# Patient Record
Sex: Female | Born: 1970
Health system: Southern US, Community
[De-identification: ages and names within clinical notes are randomized; demographics above are authoritative.]

## PROBLEM LIST (undated history)

## (undated) DIAGNOSIS — M549 Dorsalgia, unspecified: Secondary | ICD-10-CM

## (undated) DIAGNOSIS — I8393 Asymptomatic varicose veins of bilateral lower extremities: Secondary | ICD-10-CM

## (undated) HISTORY — PX: TUBAL LIGATION: SHX77

## (undated) HISTORY — DX: Asymptomatic varicose veins of bilateral lower extremities: I83.93

## (undated) HISTORY — DX: Dorsalgia, unspecified: M54.9

## (undated) HISTORY — PX: AUGMENTATION MAMMAPLASTY: SUR837

---

## 2004-11-29 ENCOUNTER — Emergency Department: Payer: Self-pay | Admitting: Emergency Medicine

## 2016-02-11 DIAGNOSIS — M5402 Panniculitis affecting regions of neck and back, cervical region: Secondary | ICD-10-CM | POA: Diagnosis not present

## 2016-02-11 DIAGNOSIS — M5405 Panniculitis affecting regions of neck and back, thoracolumbar region: Secondary | ICD-10-CM | POA: Diagnosis not present

## 2016-02-11 DIAGNOSIS — M9903 Segmental and somatic dysfunction of lumbar region: Secondary | ICD-10-CM | POA: Diagnosis not present

## 2016-02-25 DIAGNOSIS — L570 Actinic keratosis: Secondary | ICD-10-CM | POA: Diagnosis not present

## 2016-02-25 DIAGNOSIS — I788 Other diseases of capillaries: Secondary | ICD-10-CM | POA: Diagnosis not present

## 2016-02-25 DIAGNOSIS — D1801 Hemangioma of skin and subcutaneous tissue: Secondary | ICD-10-CM | POA: Diagnosis not present

## 2016-02-29 ENCOUNTER — Other Ambulatory Visit: Payer: Self-pay | Admitting: *Deleted

## 2016-02-29 DIAGNOSIS — I83812 Varicose veins of left lower extremities with pain: Secondary | ICD-10-CM

## 2016-03-02 ENCOUNTER — Encounter (HOSPITAL_COMMUNITY): Payer: Self-pay

## 2016-03-02 ENCOUNTER — Other Ambulatory Visit: Payer: Self-pay

## 2016-03-07 ENCOUNTER — Encounter: Payer: Self-pay | Admitting: Vascular Surgery

## 2016-03-09 ENCOUNTER — Ambulatory Visit (HOSPITAL_COMMUNITY)
Admission: RE | Admit: 2016-03-09 | Discharge: 2016-03-09 | Disposition: A | Discharge status: Home / Self Care | Payer: Commercial Managed Care - HMO | Source: Ambulatory Visit | Attending: Vascular Surgery | Admitting: Vascular Surgery

## 2016-03-09 DIAGNOSIS — I83812 Varicose veins of left lower extremities with pain: Secondary | ICD-10-CM | POA: Insufficient documentation

## 2016-03-13 ENCOUNTER — Encounter: Payer: Self-pay | Admitting: Vascular Surgery

## 2016-03-13 ENCOUNTER — Ambulatory Visit (INDEPENDENT_AMBULATORY_CARE_PROVIDER_SITE_OTHER): Payer: Commercial Managed Care - HMO | Admitting: Vascular Surgery

## 2016-03-13 VITALS — BP 104/73 | HR 70 | Temp 97.7°F | Resp 16 | Ht 71.5 in | Wt 195.0 lb

## 2016-03-13 DIAGNOSIS — I781 Nevus, non-neoplastic: Secondary | ICD-10-CM

## 2016-03-13 NOTE — Progress Notes (Signed)
Subjective:     Patient ID: Emily Jacobson, female   DOB: [birth year], 46 y.o.   MRN: [id number]  HPI 46 year old female evaluated for a painful varicosity in the left leg. He has been having some itching and stinging discomfort in the posterior aspect of her left leg for several months. She denies any distal edema. She has not noted bulging varicosities. She has no history of DVT, phlebitis stasis ulcers bleeding P Richards not were elastic compression stockings.  Past Medical History:  Diagnosis Date  . Varicose veins of both lower extremities     Social History  Substance Use Topics  . Smoking status: Never Smoker  . Smokeless tobacco: Never Used  . Alcohol use No    No family history on file.  Allergies  Allergen Reactions  . Penicillins Rash     Current Outpatient Prescriptions:  .  traMADol (ULTRAM) 50 MG tablet, , Disp: , Rfl:   Vitals:   03/13/16 1416  BP: 104/73  Pulse: 70  Resp: 16  Temp: 97.7 F (36.5 C)  SpO2: 100%  Weight: 195 lb (88.5 kg)  Height: 5' 11.5" (1.816 m)    Body mass index is 26.82 kg/m.         Review of Systems Chest pain, dyspnea on exertion, PND, orthopnea, hemoptysis. All systems negative and complete review of systems    Objective:   Physical Exam BP 104/73 (BP Location: Left Arm, Patient Position: Sitting, Cuff Size: Normal)   Pulse 70   Temp 97.7 F (36.5 C)   Resp 16   Ht 5' 11.5" (1.816 m)   Wt 195 lb (88.5 kg)   SpO2 100%   BMI 26.82 kg/m     Gen.-alert and oriented x3 in no apparent distress HEENT normal for age Lungs no rhonchi or wheezing Cardiovascular regular rhythm no murmurs carotid pulses 3+ palpable no bruits audible Abdomen soft nontender no palpable masses Musculoskeletal free of  major deformities Skin clear -no rashes Neurologic normal Lower extremities 3+ femoral and dorsalis pedis pulses palpable bilaterally with no edema After posterior distal lateral thigh has small area of spider veins  over 3 x 4 cm area. No bulging varicosities noted in either leg. No hyperpigmentation or ulceration noted.  Today I do bedside sono site exam of the left leg which revealed a normal sized left great and small saphenous veins with no reflux Also reviewed the report of the reflux study performed 03/09/2016 which revealed some reflux in the left great saphenous vein but a small caliber vein.       Assessment:     Spider veins left leg    Plan:     Discussed with patient the treatment would consist of foam sclerotherapy and gave her the pros and cons of this treatment She should decide she would like to further pursue this she will be in touch with Thea Silversmith for further consideration

## 2016-03-21 DIAGNOSIS — G8929 Other chronic pain: Secondary | ICD-10-CM | POA: Diagnosis not present

## 2016-03-21 DIAGNOSIS — M542 Cervicalgia: Secondary | ICD-10-CM | POA: Diagnosis not present

## 2016-03-21 DIAGNOSIS — M545 Low back pain: Secondary | ICD-10-CM | POA: Diagnosis not present

## 2016-03-28 DIAGNOSIS — M545 Low back pain: Secondary | ICD-10-CM | POA: Diagnosis not present

## 2016-04-18 DIAGNOSIS — M542 Cervicalgia: Secondary | ICD-10-CM | POA: Diagnosis not present

## 2016-04-18 DIAGNOSIS — G8929 Other chronic pain: Secondary | ICD-10-CM | POA: Diagnosis not present

## 2016-05-01 ENCOUNTER — Ambulatory Visit: Payer: Self-pay | Admitting: Obstetrics & Gynecology

## 2016-05-04 ENCOUNTER — Ambulatory Visit: Payer: Self-pay | Admitting: Obstetrics & Gynecology

## 2016-05-04 DIAGNOSIS — Z0289 Encounter for other administrative examinations: Secondary | ICD-10-CM

## 2016-05-16 DIAGNOSIS — M50122 Cervical disc disorder at C5-C6 level with radiculopathy: Secondary | ICD-10-CM | POA: Diagnosis not present

## 2016-05-16 DIAGNOSIS — M542 Cervicalgia: Secondary | ICD-10-CM | POA: Diagnosis not present

## 2016-05-16 DIAGNOSIS — G8929 Other chronic pain: Secondary | ICD-10-CM | POA: Diagnosis not present

## 2016-05-23 ENCOUNTER — Other Ambulatory Visit: Payer: Self-pay | Admitting: Obstetrics & Gynecology

## 2016-05-23 ENCOUNTER — Encounter: Payer: Self-pay | Admitting: Obstetrics & Gynecology

## 2016-05-23 ENCOUNTER — Ambulatory Visit (INDEPENDENT_AMBULATORY_CARE_PROVIDER_SITE_OTHER): Payer: Commercial Managed Care - HMO | Admitting: Obstetrics & Gynecology

## 2016-05-23 VITALS — BP 128/86 | Ht 71.0 in | Wt 198.0 lb

## 2016-05-23 DIAGNOSIS — Z01419 Encounter for gynecological examination (general) (routine) without abnormal findings: Secondary | ICD-10-CM | POA: Diagnosis not present

## 2016-05-23 DIAGNOSIS — Z789 Other specified health status: Secondary | ICD-10-CM

## 2016-05-23 DIAGNOSIS — Z9851 Tubal ligation status: Secondary | ICD-10-CM

## 2016-05-23 DIAGNOSIS — Z1231 Encounter for screening mammogram for malignant neoplasm of breast: Secondary | ICD-10-CM

## 2016-05-23 NOTE — Progress Notes (Signed)
    Emily Jacobson [birth year] [id number]   History:    46 y.o.  G2P2 S/P BT/S.  Daughter 34 yo and son 48 yo.  Remarried, moved from [place].  RP:  New patient presenting for annual gyn exam  Menses reg qmonth normal flow.  On period today.  No pelvic pain.  No vaginal d/c.  Breasts wnl.  Past medical history,surgical history, family history and social history were all reviewed and documented in the EPIC chart.  Gynecologic History Patient's last menstrual period was 05/21/2016. Contraception: tubal ligation Last Pap: 3 yrs ago. Results were: normal per patient Last mammogram: Doesn't remember. Results were: normal per patient  Obstetric History OB History  Gravida Para Term Preterm AB Living  2 2       2   SAB TAB Ectopic Multiple Live Births               # Outcome Date GA Lbr Len/2nd Weight Sex Delivery Anes PTL Lv  2 Para           1 Para                ROS: A ROS was performed and pertinent positives and negatives are included in the history.  GENERAL: No fevers or chills. HEENT: No change in vision, no earache, sore throat or sinus congestion. NECK: No pain or stiffness. CARDIOVASCULAR: No chest pain or pressure. No palpitations. PULMONARY: No shortness of breath, cough or wheeze. GASTROINTESTINAL: No abdominal pain, nausea, vomiting or diarrhea, melena or bright red blood per rectum. GENITOURINARY: No urinary frequency, urgency, hesitancy or dysuria. MUSCULOSKELETAL: No joint or muscle pain, no back pain, no recent trauma. DERMATOLOGIC: No rash, no itching, no lesions. ENDOCRINE: No polyuria, polydipsia, no heat or cold intolerance. No recent change in weight. HEMATOLOGICAL: No anemia or easy bruising or bleeding. NEUROLOGIC: No headache, seizures, numbness, tingling or weakness. PSYCHIATRIC: No depression, no loss of interest in normal activity or change in sleep pattern.     Exam:   BP 128/86   Ht 5\' 11"  (1.803 m)   Wt 198 lb (89.8 kg)   LMP 05/21/2016   BMI 27.62  kg/m   Body mass index is 27.62 kg/m.  General appearance : Well developed well nourished female. No acute distress HEENT: Eyes: no retinal hemorrhage or exudates,  Neck supple, trachea midline, no carotid bruits, no thyroidmegaly Lungs: Clear to auscultation, no rhonchi or wheezes, or rib retractions  Heart: Regular rate and rhythm, no murmurs or gallops Breast:Examined in sitting and supine position were symmetrical in appearance, no palpable masses or tenderness,  no skin retraction, no nipple inversion, no nipple discharge, no skin discoloration, no axillary or supraclavicular lymphadenopathy Abdomen: no palpable masses or tenderness, no rebound or guarding Extremities: no edema or skin discoloration or tenderness  Pelvic:  Deferred, re menstruating currently    Assessment/Plan:  46 y.o. female for annual exam   1. Well woman exam Normal general exam.  Will complete Gyn exam/Pap HPV at f/u when off period.  Schedule screening Mammo.  2. Status post tubal ligation   3. Last menstrual period (LMP) date this week On period.  Will f/u to complete Gyn exam with Pap/HPV HR.  Princess Bruins MD, 11:31 AM 05/23/2016

## 2016-05-25 NOTE — Patient Instructions (Signed)
1. Well woman exam Normal general exam.  Will complete Gyn exam/Pap HPV at f/u when off period.  Schedule screening Mammo.  2. Status post tubal ligation   3. Last menstrual period (LMP) date this week On period.  Will f/u to complete Gyn exam with Pap/HPV HR.  Kenley, it was a pleasure to meet you today!  I will see you soon to complete your Gyn exam and Pap.

## 2016-05-29 DIAGNOSIS — M542 Cervicalgia: Secondary | ICD-10-CM | POA: Diagnosis not present

## 2016-05-30 ENCOUNTER — Ambulatory Visit (INDEPENDENT_AMBULATORY_CARE_PROVIDER_SITE_OTHER): Payer: Commercial Managed Care - HMO | Admitting: Obstetrics & Gynecology

## 2016-05-30 ENCOUNTER — Encounter: Payer: Self-pay | Admitting: Obstetrics & Gynecology

## 2016-05-30 VITALS — BP 112/72

## 2016-05-30 DIAGNOSIS — Z1151 Encounter for screening for human papillomavirus (HPV): Secondary | ICD-10-CM

## 2016-05-30 DIAGNOSIS — Z124 Encounter for screening for malignant neoplasm of cervix: Secondary | ICD-10-CM | POA: Diagnosis not present

## 2016-05-30 DIAGNOSIS — N971 Female infertility of tubal origin: Secondary | ICD-10-CM

## 2016-05-30 DIAGNOSIS — Z113 Encounter for screening for infections with a predominantly sexual mode of transmission: Secondary | ICD-10-CM

## 2016-05-30 DIAGNOSIS — Z01419 Encounter for gynecological examination (general) (routine) without abnormal findings: Secondary | ICD-10-CM

## 2016-05-30 NOTE — Progress Notes (Signed)
    Emily Jacobson 05/10/1970 [id number]        46 y.o.  G2P2  S/P Tubal Ligation  RP:  Complete Annual exam with Gyn exam and Pap test  Past medical history,surgical history, problem list, medications, allergies, family history and social history were all reviewed and documented in the EPIC chart.  Directed ROS with pertinent positives and negatives documented in the history of present illness/assessment and plan.  Exam:  Vitals:   05/30/16 0949  BP: 112/72   General appearance:  Normal  Gyn exam:  Vulva normal                     Speculum:  Vagina, cervix normal.  Pap/HPV, Gono-Chlam done.  Assessment/Plan:  46 y.o. G2P2   1. Pap smear for cervical cancer screening Pap/HPV done today to complete Annual/Gyn exam as patient was on period last visit.  2. Gynecologic exam normal   3. Screen for STD (sexually transmitted disease) Gono-Chlam pending.  4. 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  AMA 46 yo.  Counseling on Fertility.  Discussed [facility] to evaluate Ovarian potential for Hyperstimulation/IVF and discussed Tubal reversal.  Risks/Benefits/Likelihood to conceive reviewed.  Need to obtain O.R. BT/S if going that route, but recommended [facility] with referral for IVF if good.  Counseling >50% on Secondary tubal infertility/AMA x 10 minutes.  Princess Bruins MD, 9:58 AM 05/30/2016

## 2016-05-30 NOTE — Patient Instructions (Signed)
1. Pap smear for cervical cancer screening Pap/HPV done today to complete Annual/Gyn exam as patient was on period last visit.  2. Gynecologic exam normal   3. Screen for STD (sexually transmitted disease) Gono-Chlam pending.  4. 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  AMA 46 yo.  Counseling on Fertility.  Discussed [facility] to evaluate Ovarian potential for Hyperstimulation/IVF and discussed Tubal reversal.  Risks/Benefits/Likelihood to conceive reviewed.  Need to obtain O.R. BT/S if going that route, but recommended [facility] with referral for IVF if good.   Good to see you today Emily Jacobson!  Let me know if you want to proceed with Fertility management.

## 2016-06-02 LAB — PAP IG, CT-NG NAA, HPV HIGH-RISK
Chlamydia Probe Amp: NOT DETECTED
GC Probe Amp: NOT DETECTED
HPV DNA HIGH RISK: NOT DETECTED

## 2016-06-13 DIAGNOSIS — M50122 Cervical disc disorder at C5-C6 level with radiculopathy: Secondary | ICD-10-CM | POA: Diagnosis not present

## 2016-06-13 DIAGNOSIS — M5136 Other intervertebral disc degeneration, lumbar region: Secondary | ICD-10-CM | POA: Diagnosis not present

## 2016-06-19 ENCOUNTER — Ambulatory Visit: Payer: Commercial Managed Care - HMO

## 2016-06-29 DIAGNOSIS — I83813 Varicose veins of bilateral lower extremities with pain: Secondary | ICD-10-CM | POA: Diagnosis not present

## 2016-07-11 DIAGNOSIS — M50122 Cervical disc disorder at C5-C6 level with radiculopathy: Secondary | ICD-10-CM | POA: Diagnosis not present

## 2016-07-11 DIAGNOSIS — M5136 Other intervertebral disc degeneration, lumbar region: Secondary | ICD-10-CM | POA: Diagnosis not present

## 2016-07-18 DIAGNOSIS — I83813 Varicose veins of bilateral lower extremities with pain: Secondary | ICD-10-CM | POA: Diagnosis not present

## 2016-08-01 DIAGNOSIS — I83813 Varicose veins of bilateral lower extremities with pain: Secondary | ICD-10-CM | POA: Diagnosis not present

## 2016-10-26 DIAGNOSIS — M5136 Other intervertebral disc degeneration, lumbar region: Secondary | ICD-10-CM | POA: Diagnosis not present

## 2016-11-17 DIAGNOSIS — I8311 Varicose veins of right lower extremity with inflammation: Secondary | ICD-10-CM | POA: Diagnosis not present

## 2016-11-17 DIAGNOSIS — I83811 Varicose veins of right lower extremities with pain: Secondary | ICD-10-CM | POA: Diagnosis not present

## 2016-11-21 DIAGNOSIS — I8311 Varicose veins of right lower extremity with inflammation: Secondary | ICD-10-CM | POA: Diagnosis not present

## 2016-12-12 DIAGNOSIS — D1721 Benign lipomatous neoplasm of skin and subcutaneous tissue of right arm: Secondary | ICD-10-CM | POA: Diagnosis not present

## 2017-01-02 ENCOUNTER — Ambulatory Visit: Payer: Commercial Managed Care - HMO

## 2017-02-01 ENCOUNTER — Ambulatory Visit
Admission: RE | Admit: 2017-02-01 | Discharge: 2017-02-01 | Disposition: A | Discharge status: Home / Self Care | Payer: Commercial Managed Care - HMO | Source: Ambulatory Visit | Attending: Obstetrics & Gynecology | Admitting: Obstetrics & Gynecology

## 2017-02-01 DIAGNOSIS — Z1231 Encounter for screening mammogram for malignant neoplasm of breast: Secondary | ICD-10-CM

## 2017-02-01 DIAGNOSIS — J0140 Acute pansinusitis, unspecified: Secondary | ICD-10-CM | POA: Diagnosis not present

## 2017-02-22 DIAGNOSIS — M79644 Pain in right finger(s): Secondary | ICD-10-CM | POA: Diagnosis not present

## 2017-02-22 DIAGNOSIS — S63632A Sprain of interphalangeal joint of right middle finger, initial encounter: Secondary | ICD-10-CM | POA: Diagnosis not present

## 2017-02-27 DIAGNOSIS — M5030 Other cervical disc degeneration, unspecified cervical region: Secondary | ICD-10-CM | POA: Diagnosis not present

## 2017-02-27 DIAGNOSIS — M47816 Spondylosis without myelopathy or radiculopathy, lumbar region: Secondary | ICD-10-CM | POA: Diagnosis not present

## 2017-03-07 DIAGNOSIS — I83811 Varicose veins of right lower extremities with pain: Secondary | ICD-10-CM | POA: Diagnosis not present

## 2017-03-07 DIAGNOSIS — I8311 Varicose veins of right lower extremity with inflammation: Secondary | ICD-10-CM | POA: Diagnosis not present

## 2017-03-12 DIAGNOSIS — M545 Low back pain: Secondary | ICD-10-CM | POA: Diagnosis not present

## 2017-03-12 DIAGNOSIS — R51 Headache: Secondary | ICD-10-CM | POA: Diagnosis not present

## 2017-03-12 DIAGNOSIS — M542 Cervicalgia: Secondary | ICD-10-CM | POA: Diagnosis not present

## 2017-03-20 DIAGNOSIS — M5033 Other cervical disc degeneration, cervicothoracic region: Secondary | ICD-10-CM | POA: Diagnosis not present

## 2017-03-22 DIAGNOSIS — I788 Other diseases of capillaries: Secondary | ICD-10-CM | POA: Diagnosis not present

## 2017-03-22 DIAGNOSIS — L720 Epidermal cyst: Secondary | ICD-10-CM | POA: Diagnosis not present

## 2017-03-22 DIAGNOSIS — M545 Low back pain: Secondary | ICD-10-CM | POA: Diagnosis not present

## 2017-03-22 DIAGNOSIS — C44719 Basal cell carcinoma of skin of left lower limb, including hip: Secondary | ICD-10-CM | POA: Diagnosis not present

## 2017-03-22 DIAGNOSIS — M542 Cervicalgia: Secondary | ICD-10-CM | POA: Diagnosis not present

## 2017-03-22 DIAGNOSIS — R51 Headache: Secondary | ICD-10-CM | POA: Diagnosis not present

## 2017-04-02 DIAGNOSIS — R51 Headache: Secondary | ICD-10-CM | POA: Diagnosis not present

## 2017-04-02 DIAGNOSIS — M542 Cervicalgia: Secondary | ICD-10-CM | POA: Diagnosis not present

## 2017-04-02 DIAGNOSIS — M545 Low back pain: Secondary | ICD-10-CM | POA: Diagnosis not present

## 2017-04-13 DIAGNOSIS — I83811 Varicose veins of right lower extremities with pain: Secondary | ICD-10-CM | POA: Diagnosis not present

## 2017-04-13 DIAGNOSIS — I8311 Varicose veins of right lower extremity with inflammation: Secondary | ICD-10-CM | POA: Diagnosis not present

## 2017-04-15 DIAGNOSIS — J209 Acute bronchitis, unspecified: Secondary | ICD-10-CM | POA: Diagnosis not present

## 2017-05-05 ENCOUNTER — Encounter (HOSPITAL_COMMUNITY): Payer: Self-pay | Admitting: Emergency Medicine

## 2017-05-05 ENCOUNTER — Ambulatory Visit (HOSPITAL_COMMUNITY)
Admission: EM | Admit: 2017-05-05 | Discharge: 2017-05-05 | Disposition: A | Discharge status: Home / Self Care | Payer: 59 | Attending: Family Medicine | Admitting: Family Medicine

## 2017-05-05 DIAGNOSIS — H66001 Acute suppurative otitis media without spontaneous rupture of ear drum, right ear: Secondary | ICD-10-CM

## 2017-05-05 MED ORDER — CEFDINIR 300 MG PO CAPS: 600.0000 mg | ORAL_CAPSULE | Freq: Every day | ORAL | 0 refills | Status: AC

## 2017-05-05 NOTE — ED Triage Notes (Signed)
Pt states she recently got over a sinus infection and now c/o R ear pain, with some L ear pain as well

## 2017-05-05 NOTE — Discharge Instructions (Signed)
Push fluids to ensure adequate hydration and keep secretions thin.  Tylenol and/or ibuprofen as needed for pain or fevers.  Complete course of antibiotics.  Continue with daily flonase, antihistamine and/or nasal flush or rinses may also be helpful.

## 2017-05-05 NOTE — ED Provider Notes (Signed)
[facility]    CSN: [id number] Arrival date & time: 05/05/17  1200     History   Chief Complaint Chief Complaint  Patient presents with  . Otalgia    HPI Emily Jacobson is a 47 y.o. female.   Emily Jacobson presents with complaints of right ear pain which has been worsening over the past two days. Was treated with antibiotics, steroids, nebulizer's approximately 2 weeks ago for sinus infection and bronchitis, completed course of treatment approximately 8 days ago. She feels she had approximately 3 days of improved symptoms before slight cough and congestion returned. Now with ear pain and sensation of being "under water" with fullness to bilateral ears. Has been using flonase daily. Took ibuprofen last night which did help with pain. States has had similar in the past which required antibiotics. No fevers. Without shortness of breath or chest pain. Slight congestion. Without contributing medical history.    ROS per HPI.      Past Medical History:  Diagnosis Date  . Varicose veins of both lower extremities     Patient Active Problem List   Diagnosis Date Noted  . Spider angioma of skin 03/13/2016    Past Surgical History:  Procedure Laterality Date  . AUGMENTATION MAMMAPLASTY Bilateral    Saline  . TUBAL LIGATION      OB History    Gravida  2   Para  2   Term      Preterm      AB      Living  2     SAB      TAB      Ectopic      Multiple      Live Births               Home Medications    Prior to Admission medications   Medication Sig Start Date End Date Taking? Authorizing Provider  cefdinir (OMNICEF) 300 MG capsule Take 2 capsules (600 mg total) by mouth daily for 5 days. 05/05/17 05/10/17  Zigmund Gottron, NP  ibuprofen (ADVIL,MOTRIN) 400 MG tablet Take 400 mg by mouth every 6 (six) hours as needed.    [provider]  traMADol Veatrice Bourbon) 50 MG tablet  03/09/16   [provider]    Family History Family History    Problem Relation Age of Onset  . Migraines Brother   . Cancer Paternal Aunt        colon   . Breast cancer Paternal Aunt   . Breast cancer Maternal Grandmother   . Cancer Maternal Grandmother        lung  . Breast cancer Paternal Aunt     Social History Social History   Tobacco Use  . Smoking status: Never Smoker  . Smokeless tobacco: Never Used  Substance Use Topics  . Alcohol use: No  . Drug use: No     Allergies   Penicillins   Review of Systems Review of Systems   Physical Exam Triage Vital Signs ED Triage Vitals [05/05/17 1211]  Enc Vitals Group     BP (!) 150/129     Pulse Rate 78     Resp 18     Temp (!) 97.1 F (36.2 C)     Temp src      SpO2 99 %     Weight      Height      Head Circumference      Peak Flow  Pain Score      Pain Loc      Pain Edu?      Excl. in [place]?    No data found.  Updated Vital Signs BP (!) 150/129   Pulse 78   Temp (!) 97.1 F (36.2 C)   Resp 18   SpO2 99%   Visual Acuity Right Eye Distance:   Left Eye Distance:   Bilateral Distance:    Right Eye Near:   Left Eye Near:    Bilateral Near:     Physical Exam  Constitutional: She is oriented to person, place, and time. She appears well-developed and well-nourished. No distress.  HENT:  Head: Normocephalic and atraumatic.  Right Ear: External ear and ear canal normal. Tympanic membrane is erythematous.  Left Ear: External ear and ear canal normal. A middle ear effusion is present.  Nose: Nose normal.  Mouth/Throat: Uvula is midline, oropharynx is clear and moist and mucous membranes are normal. No tonsillar exudate.  Mild erythema to right TM without significant bulging  Eyes: Pupils are equal, round, and reactive to light. Conjunctivae and EOM are normal.  Cardiovascular: Normal rate, regular rhythm and normal heart sounds.  Pulmonary/Chest: Effort normal and breath sounds normal.  Neurological: She is alert and oriented to person, place, and time.   Skin: Skin is warm and dry.     UC Treatments / Results  Labs (all labs ordered are listed, but only abnormal results are displayed) Labs Reviewed - No data to display  EKG None Radiology No results found.  Procedures Procedures (including critical care time)  Medications Ordered in UC Medications - No data to display   Initial Impression / Assessment and Plan / UC Course  I have reviewed the triage vital signs and the nursing notes.  Pertinent labs & imaging results that were available during my care of the patient were reviewed by me and considered in my medical decision making (see chart for details).     Mild OM on exam. Discussed supportive cares with patient, as well as 5 day course of cefdinir. Patient verbalized understanding and agreeable to plan.  If symptoms worsen or do not improve in the next week to return to be seen or to follow up with PCP.    Final Clinical Impressions(s) / UC Diagnoses   Final diagnoses:  Acute suppurative otitis media of right ear without spontaneous rupture of tympanic membrane, recurrence not specified    ED Discharge Orders        Ordered    cefdinir (OMNICEF) 300 MG capsule  Daily     05/05/17 1222       Controlled Substance Prescriptions [place] Controlled Substance Registry consulted? Not Applicable   Zigmund Gottron, NP 05/05/17 1230

## 2017-06-26 DIAGNOSIS — M47816 Spondylosis without myelopathy or radiculopathy, lumbar region: Secondary | ICD-10-CM | POA: Diagnosis not present

## 2017-07-26 ENCOUNTER — Encounter: Payer: Commercial Managed Care - HMO | Admitting: Obstetrics & Gynecology

## 2017-08-14 DIAGNOSIS — M25552 Pain in left hip: Secondary | ICD-10-CM | POA: Diagnosis not present

## 2017-08-14 DIAGNOSIS — M5030 Other cervical disc degeneration, unspecified cervical region: Secondary | ICD-10-CM | POA: Diagnosis not present

## 2017-08-14 DIAGNOSIS — M47816 Spondylosis without myelopathy or radiculopathy, lumbar region: Secondary | ICD-10-CM | POA: Diagnosis not present

## 2017-09-13 DIAGNOSIS — I83811 Varicose veins of right lower extremities with pain: Secondary | ICD-10-CM | POA: Diagnosis not present

## 2017-09-13 DIAGNOSIS — M545 Low back pain: Secondary | ICD-10-CM | POA: Diagnosis not present

## 2017-09-13 DIAGNOSIS — I8311 Varicose veins of right lower extremity with inflammation: Secondary | ICD-10-CM | POA: Diagnosis not present

## 2017-09-19 DIAGNOSIS — M5030 Other cervical disc degeneration, unspecified cervical region: Secondary | ICD-10-CM | POA: Diagnosis not present

## 2017-09-19 DIAGNOSIS — M47816 Spondylosis without myelopathy or radiculopathy, lumbar region: Secondary | ICD-10-CM | POA: Diagnosis not present

## 2017-09-19 DIAGNOSIS — M5416 Radiculopathy, lumbar region: Secondary | ICD-10-CM | POA: Diagnosis not present

## 2017-09-25 DIAGNOSIS — M7062 Trochanteric bursitis, left hip: Secondary | ICD-10-CM | POA: Diagnosis not present

## 2017-09-25 DIAGNOSIS — M25552 Pain in left hip: Secondary | ICD-10-CM | POA: Diagnosis not present

## 2017-09-25 DIAGNOSIS — M5136 Other intervertebral disc degeneration, lumbar region: Secondary | ICD-10-CM | POA: Diagnosis not present

## 2017-10-03 DIAGNOSIS — M25552 Pain in left hip: Secondary | ICD-10-CM | POA: Diagnosis not present

## 2017-10-04 DIAGNOSIS — M47816 Spondylosis without myelopathy or radiculopathy, lumbar region: Secondary | ICD-10-CM | POA: Diagnosis not present

## 2017-10-04 DIAGNOSIS — M5416 Radiculopathy, lumbar region: Secondary | ICD-10-CM | POA: Diagnosis not present

## 2017-10-04 DIAGNOSIS — M5030 Other cervical disc degeneration, unspecified cervical region: Secondary | ICD-10-CM | POA: Diagnosis not present

## 2017-10-09 DIAGNOSIS — M25552 Pain in left hip: Secondary | ICD-10-CM | POA: Diagnosis not present

## 2017-10-10 DIAGNOSIS — M25552 Pain in left hip: Secondary | ICD-10-CM | POA: Diagnosis not present

## 2017-10-16 ENCOUNTER — Ambulatory Visit: Payer: 59 | Admitting: Obstetrics & Gynecology

## 2017-10-16 ENCOUNTER — Encounter: Payer: Self-pay | Admitting: Obstetrics & Gynecology

## 2017-10-16 VITALS — BP 126/82 | Ht 71.0 in | Wt 208.0 lb

## 2017-10-16 DIAGNOSIS — Z01419 Encounter for gynecological examination (general) (routine) without abnormal findings: Secondary | ICD-10-CM | POA: Diagnosis not present

## 2017-10-16 DIAGNOSIS — N943 Premenstrual tension syndrome: Secondary | ICD-10-CM | POA: Diagnosis not present

## 2017-10-16 DIAGNOSIS — R4586 Emotional lability: Secondary | ICD-10-CM | POA: Diagnosis not present

## 2017-10-16 DIAGNOSIS — Z9851 Tubal ligation status: Secondary | ICD-10-CM

## 2017-10-16 LAB — TSH: TSH: 0.74 m[IU]/L

## 2017-10-16 MED ORDER — NORETHINDRONE 0.35 MG PO TABS: 1.0000 | ORAL_TABLET | Freq: Every day | ORAL | 4 refills | Status: DC

## 2017-10-16 NOTE — Patient Instructions (Signed)
1. Well female exam with routine gynecological exam Normal gynecologic exam.  Pap negative with negative high-risk HPV in May 2018.  No indication to repeat this year.  Breast exam normal.  Per patient screening mammogram normal in 2019.  Body mass index 29.01.  Recommend low calorie/carb diet such as [employer] and increased aerobic activities to 5 times a week with weightlifting every 2 days.  Will establish with a family physician for health labs.  2. S/P tubal ligation  3. PMS (premenstrual syndrome) Given patient's emotional hypersensitivity worsen in the premenstrual period, decision to start on the progesterone only birth control pill to block ovulation and therefore stabilized hormones.  Usage and the fact that it is low risk discussed with patient.  Prescription sent to pharmacy.  Will start with next menstrual period. - TSH  4. Emotional hypersensitivity Rule out thyroid dysfunction.  Start on the progesterone pill to see if any improvement by stabilization of her hormones.  Encouraged to exercise more regularly as releasing endorphins may help. - TSH  Other orders - norethindrone (MICRONOR,CAMILA,ERRIN) 0.35 MG tablet; Take 1 tablet (0.35 mg total) by mouth daily.  Layana, it was a pleasure seeing you today!  I will inform you of your results as soon as they are available.

## 2017-10-16 NOTE — Progress Notes (Signed)
Emily Jacobson Aug 07, 1970 [id number]   History:    47 y.o. G2P2L2 Remarried.  S/P TL.  Son 60 yo.  Daughter 56 yo in [place]/my patient, doing well on BCPs  RP:  Established patient presenting for annual gyn exam   HPI: Status post tubal ligation.  Menstrual periods regularly every month with normal flow.  Complains of hyper emotivity with worsened symptoms in the premenstrual period.  Cries easily for happy or sad events.  No symptoms of major depression and no suicidal ideation.  No pelvic pain.  No pain with intercourse.  Normal vaginal secretions.  Breasts normal.  Urine and bowel movements normal.  Body mass index 29.01.  Not currently very physically active.  Will find a family physician for health labs.  Investigated recently for left hip pain.  Had an MRI.  Followed by Ortho.  Will sign a release for me to give my opinion on the labs that were done associated with that investigation.  Thank you thank I can give it right back  Past medical history,surgical history, family history and social history were all reviewed and documented in the EPIC chart.  Gynecologic History Patient's last menstrual period was 10/02/2017. Contraception: tubal ligation Last Pap: 05/2016. Results were: Negative/HPV HR neg Last mammogram: 2019. Results were: normal per patient Bone Density: Never Colonoscopy: Never  Obstetric History OB History  Gravida Para Term Preterm AB Living  2 2       2   SAB TAB Ectopic Multiple Live Births               # Outcome Date GA Lbr Len/2nd Weight Sex Delivery Anes PTL Lv  2 Para           1 Para              ROS: A ROS was performed and pertinent positives and negatives are included in the history.  GENERAL: No fevers or chills. HEENT: No change in vision, no earache, sore throat or sinus congestion. NECK: No pain or stiffness. CARDIOVASCULAR: No chest pain or pressure. No palpitations. PULMONARY: No shortness of breath, cough or wheeze. GASTROINTESTINAL: No  abdominal pain, nausea, vomiting or diarrhea, melena or bright red blood per rectum. GENITOURINARY: No urinary frequency, urgency, hesitancy or dysuria. MUSCULOSKELETAL: No joint or muscle pain, no back pain, no recent trauma. DERMATOLOGIC: No rash, no itching, no lesions. ENDOCRINE: No polyuria, polydipsia, no heat or cold intolerance. No recent change in weight. HEMATOLOGICAL: No anemia or easy bruising or bleeding. NEUROLOGIC: No headache, seizures, numbness, tingling or weakness. PSYCHIATRIC: No depression, no loss of interest in normal activity or change in sleep pattern.     Exam:   BP 126/82   Ht 5\' 11"  (1.803 m)   Wt 208 lb (94.3 kg)   LMP 10/02/2017 Comment: no birth control   BMI 29.01 kg/m   Body mass index is 29.01 kg/m.  General appearance : Well developed well nourished female. No acute distress HEENT: Eyes: no retinal hemorrhage or exudates,  Neck supple, trachea midline, no carotid bruits, no thyroidmegaly Lungs: Clear to auscultation, no rhonchi or wheezes, or rib retractions  Heart: Regular rate and rhythm, no murmurs or gallops Breast:Examined in sitting and supine position were symmetrical in appearance, no palpable masses or tenderness,  no skin retraction, no nipple inversion, no nipple discharge, no skin discoloration, no axillary or supraclavicular lymphadenopathy Abdomen: no palpable masses or tenderness, no rebound or guarding Extremities: no edema or skin discoloration or tenderness  Pelvic: Vulva: Normal             Vagina: No gross lesions or discharge  Cervix: No gross lesions or discharge  Uterus  AV, normal size, shape and consistency, non-tender and mobile  Adnexa  Without masses or tenderness  Anus: Normal   Assessment/Plan:  47 y.o. female for annual exam   1. Well female exam with routine gynecological exam Normal gynecologic exam.  Pap negative with negative high-risk HPV in May 2018.  No indication to repeat this year.  Breast exam normal.  Per  patient screening mammogram normal in 2019.  Body mass index 29.01.  Recommend low calorie/carb diet such as [employer] and increased aerobic activities to 5 times a week with weightlifting every 2 days.  Will establish with a family physician for health labs.  2. S/P tubal ligation  3. PMS (premenstrual syndrome) Given patient's emotional hypersensitivity worsen in the premenstrual period, decision to start on the progesterone only birth control pill to block ovulation and therefore stabilized hormones.  Usage and the fact that it is low risk discussed with patient.  Prescription sent to pharmacy.  Will start with next menstrual period. - TSH  4. Emotional hypersensitivity Rule out thyroid dysfunction.  Start on the progesterone pill to see if any improvement by stabilization of her hormones.  Encouraged to exercise more regularly as releasing endorphins may help. - TSH  Other orders - norethindrone (MICRONOR,CAMILA,ERRIN) 0.35 MG tablet; Take 1 tablet (0.35 mg total) by mouth daily.  Counseling on above issues and coordination of care more than 50% for 10 minutes.  Princess Bruins MD, 10:50 AM 10/16/2017

## 2017-10-18 ENCOUNTER — Telehealth: Payer: Self-pay | Admitting: Hematology and Oncology

## 2017-10-18 DIAGNOSIS — M5416 Radiculopathy, lumbar region: Secondary | ICD-10-CM | POA: Diagnosis not present

## 2017-10-18 NOTE — Telephone Encounter (Signed)
New referral received from Dr. Lyla Glassing for abnormal rbc. Pt has been scheduled to see Dr. Lindi Adie on 10/7 at 915am. Pt aware to arrive 30 minutes early. Address provided.

## 2017-10-22 ENCOUNTER — Inpatient Hospital Stay: Payer: 59 | Attending: Hematology and Oncology | Admitting: Hematology and Oncology

## 2017-10-22 ENCOUNTER — Inpatient Hospital Stay: Payer: 59

## 2017-10-22 DIAGNOSIS — D721 Eosinophilia, unspecified: Secondary | ICD-10-CM

## 2017-10-22 DIAGNOSIS — M25552 Pain in left hip: Secondary | ICD-10-CM | POA: Diagnosis not present

## 2017-10-22 DIAGNOSIS — Z801 Family history of malignant neoplasm of trachea, bronchus and lung: Secondary | ICD-10-CM

## 2017-10-22 DIAGNOSIS — Z79899 Other long term (current) drug therapy: Secondary | ICD-10-CM | POA: Diagnosis not present

## 2017-10-22 DIAGNOSIS — Z800 Family history of malignant neoplasm of digestive organs: Secondary | ICD-10-CM | POA: Diagnosis not present

## 2017-10-22 DIAGNOSIS — Z803 Family history of malignant neoplasm of breast: Secondary | ICD-10-CM

## 2017-10-22 LAB — CBC WITH DIFFERENTIAL (CANCER CENTER ONLY)
BASOS PCT: 1 %
Basophils Absolute: 0.1 10*3/uL (ref 0.0–0.1)
EOS ABS: 0.4 10*3/uL (ref 0.0–0.5)
EOS PCT: 5 %
HCT: 40.8 % (ref 34.8–46.6)
HEMOGLOBIN: 14 g/dL (ref 11.6–15.9)
LYMPHS ABS: 1.9 10*3/uL (ref 0.9–3.3)
Lymphocytes Relative: 21 %
MCH: 29.8 pg (ref 25.1–34.0)
MCHC: 34.3 g/dL (ref 31.5–36.0)
MCV: 87 fL (ref 79.5–101.0)
Monocytes Absolute: 0.6 10*3/uL (ref 0.1–0.9)
Monocytes Relative: 7 %
NEUTROS PCT: 66 %
Neutro Abs: 5.7 10*3/uL (ref 1.5–6.5)
PLATELETS: 329 10*3/uL (ref 145–400)
RBC: 4.68 MIL/uL (ref 3.70–5.45)
RDW: 13.7 % (ref 11.2–14.5)
WBC Count: 8.8 10*3/uL (ref 3.9–10.3)

## 2017-10-22 NOTE — Progress Notes (Signed)
[facility] CONSULT NOTE  Patient Care Team: Patient, No Pcp Per as PCP - General (General Practice)  CHIEF COMPLAINTS/PURPOSE OF CONSULTATION:  Elevated eosinophil count  HISTORY OF PRESENTING ILLNESS:  Emily Jacobson 47 y.o. female is here because of recent diagnosis of elevated eosinophil count.  Patient has been going through chronic back and hip pains for which she has seen orthopedics.  Over the past 2 years she had received cortisone injections and is scheduled to undergo cortisone injection into her left hip.  Because of the left hip pain she underwent a MRI of the hip which revealed prominent hematopoietic marrow with extension to the femoral head from red marrow reconversion.  This led to a CBC that revealed slight elevation of eosinophil count.  This prompted a referral for [place].  I reviewed her records extensively and collaborated the history with the patient.  MEDICAL HISTORY:  Past Medical History:  Diagnosis Date  . Varicose veins of both lower extremities     SURGICAL HISTORY: Past Surgical History:  Procedure Laterality Date  . AUGMENTATION MAMMAPLASTY Bilateral    Saline  . TUBAL LIGATION      SOCIAL HISTORY: Social History   Socioeconomic History  . Marital status: Married    Spouse name: Not on file  . Number of children: Not on file  . Years of education: Not on file  . Highest education level: Not on file  Occupational History  . Not on file  Social Needs  . Financial resource strain: Not on file  . Food insecurity:    Worry: Not on file    Inability: Not on file  . Transportation needs:    Medical: Not on file    Non-medical: Not on file  Tobacco Use  . Smoking status: Never Smoker  . Smokeless tobacco: Never Used  Substance and Sexual Activity  . Alcohol use: No  . Drug use: No  . Sexual activity: Yes    Partners: Male    Birth control/protection: None, Surgical    Comment: tubal ligation  Lifestyle  . Physical activity:     Days per week: Not on file    Minutes per session: Not on file  . Stress: Not on file  Relationships  . Social connections:    Talks on phone: Not on file    Gets together: Not on file    Attends religious service: Not on file    Active member of club or organization: Not on file    Attends meetings of clubs or organizations: Not on file    Relationship status: Not on file  . Intimate partner violence:    Fear of current or ex partner: Not on file    Emotionally abused: Not on file    Physically abused: Not on file    Forced sexual activity: Not on file  Other Topics Concern  . Not on file  Social History Narrative  . Not on file    FAMILY HISTORY: Family History  Problem Relation Age of Onset  . Migraines Brother   . Cancer Paternal Aunt        colon   . Breast cancer Paternal Aunt   . Breast cancer Maternal Grandmother   . Cancer Maternal Grandmother        lung  . Breast cancer Paternal Aunt     ALLERGIES:  is allergic to penicillins.  MEDICATIONS:  Current Outpatient Medications  Medication Sig Dispense Refill  . HYDROcodone-acetaminophen (NORCO/VICODIN) 5-325 MG  tablet Take 1 tablet by mouth every 6 (six) hours as needed for moderate pain.    Marland Kitchen ibuprofen (ADVIL,MOTRIN) 400 MG tablet Take 400 mg by mouth every 6 (six) hours as needed.    . norethindrone (MICRONOR,CAMILA,ERRIN) 0.35 MG tablet Take 1 tablet (0.35 mg total) by mouth daily. 3 Package 4   No current facility-administered medications for this visit.     REVIEW OF SYSTEMS:   Constitutional: Denies fevers, chills or abnormal night sweats Eyes: Denies blurriness of vision, double vision or watery eyes Ears, nose, mouth, throat, and face: Denies mucositis or sore throat Respiratory: Denies cough, dyspnea or wheezes Cardiovascular: Denies palpitation, chest discomfort or lower extremity swelling Gastrointestinal:  Denies nausea, heartburn or change in bowel habits Skin: Denies abnormal skin  rashes Lymphatics: Denies new lymphadenopathy or easy bruising Neurological:Denies numbness, tingling or new weaknesses Behavioral/Psych: Mood is stable, no new changes  Extremities: Pain in the left hip All other systems were reviewed with the patient and are negative.  PHYSICAL EXAMINATION: ECOG PERFORMANCE STATUS: 1 - Symptomatic but completely ambulatory  Vitals:   10/22/17 0939  BP: 114/83  Pulse: 87  Resp: 18  Temp: 98.9 F (37.2 C)  SpO2: 100%   Filed Weights   10/22/17 0939  Weight: 204 lb 1.6 oz (92.6 kg)    GENERAL:alert, no distress and comfortable SKIN: skin color, texture, turgor are normal, no rashes or significant lesions EYES: normal, conjunctiva are pink and non-injected, sclera clear OROPHARYNX:no exudate, no erythema and lips, buccal mucosa, and tongue normal  NECK: supple, thyroid normal size, non-tender, without nodularity LYMPH:  no palpable lymphadenopathy in the cervical, axillary or inguinal LUNGS: clear to auscultation and percussion with normal breathing effort HEART: regular rate & rhythm and no murmurs and no lower extremity edema ABDOMEN:abdomen soft, non-tender and normal bowel sounds Musculoskeletal:no cyanosis of digits and no clubbing  PSYCH: alert & oriented x 3 with fluent speech NEURO: no focal motor/sensory deficits  LABORATORY DATA:  I have reviewed the data as listed Lab Results  Component Value Date   WBC 8.8 10/22/2017   HGB 14.0 10/22/2017   HCT 40.8 10/22/2017   MCV 87.0 10/22/2017   PLT 329 10/22/2017   No results found for: NA, K, CL, CO2  RADIOGRAPHIC STUDIES: I have personally reviewed the radiological reports and agreed with the findings in the report.  ASSESSMENT AND PLAN:  Eosinophilia Absolute eosinophil count 10/10/2017: 552 (normal is less than 500) Recent muscle and joint aches in back and hips requiring cortisone injections for the past 2 years.  MRI left hip: Prominent hematopoietic marrow with extension  into the femoral head likely red marrow reconversion.  Counseling: I explained to the patient what his neutrophils are and also the normal ranges.  Eosinophils greater than 1500 would raise a suspicion for hypereosinophilic syndromes. Eosinophil counts between 500- 1500 could be secondary to whole host of conditions which could be characterized into  1.  Allergic disorders like asthma and allergic dermatitis and rhinitis etc. 2. infections: Viral fungi and parasitic infections 3.  Medications 4.  Bone marrow disorders including malignancies.  There is nothing to suggest a bone marrow disorder based upon the lab work.  It could be a benign transient increase in the use of a count.  Recommendation: 1.  Recheck CBC today: Absolute eosinophilic count is 462: Normal Because of this we decided not to repeat blood work in 3 months.  I assured the patient that increase in the eosinophil count is  a sign of an underlying process and not necessarily a disease.  Return to clinic as needed    All questions were answered. The patient knows to call the clinic with any problems, questions or concerns.    Harriette Ohara, MD 10/22/17

## 2017-10-22 NOTE — Assessment & Plan Note (Signed)
Absolute eosinophil count 10/10/2017: 552 (normal is less than 500) Recent muscle and joint aches in back and hips requiring cortisone injections for the past 2 years.  MRI left hip: Prominent hematopoietic marrow with extension into the femoral head likely red marrow reconversion.  Counseling: I explained to the patient what his neutrophils are and also the normal ranges.  Eosinophils greater than 1500 would raise a suspicion for hypereosinophilic syndromes. Eosinophil counts between 500- 1500 could be secondary to whole host of conditions which could be characterized into  1.  Allergic disorders like asthma and allergic dermatitis and rhinitis etc. 2. infections: Viral fungi and parasitic infections 3.  Medications 4.  Bone marrow disorders including malignancies.  There is nothing to suggest a bone marrow disorder based upon the lab work.  It could be a benign transient increase in the use of a count.  Recommendation: 1.  Recheck CBC today 2. recheck CBC again in 3 months and follow-up  I assured the patient that increase in the eosinophil count is a sign of an underlying process and not necessarily a disease.   Return to clinic in 3 months with labs and follow-up

## 2017-10-24 DIAGNOSIS — M25552 Pain in left hip: Secondary | ICD-10-CM | POA: Diagnosis not present

## 2017-10-29 ENCOUNTER — Encounter: Payer: Commercial Managed Care - HMO | Admitting: Obstetrics & Gynecology

## 2017-11-05 ENCOUNTER — Encounter: Payer: Self-pay | Admitting: Obstetrics & Gynecology

## 2017-11-20 DIAGNOSIS — Z85828 Personal history of other malignant neoplasm of skin: Secondary | ICD-10-CM | POA: Diagnosis not present

## 2017-11-20 DIAGNOSIS — L578 Other skin changes due to chronic exposure to nonionizing radiation: Secondary | ICD-10-CM | POA: Diagnosis not present

## 2017-11-21 DIAGNOSIS — E288 Other ovarian dysfunction: Secondary | ICD-10-CM | POA: Diagnosis not present

## 2017-11-21 DIAGNOSIS — E663 Overweight: Secondary | ICD-10-CM | POA: Diagnosis not present

## 2017-11-21 DIAGNOSIS — E038 Other specified hypothyroidism: Secondary | ICD-10-CM | POA: Diagnosis not present

## 2018-02-06 DIAGNOSIS — S233XXA Sprain of ligaments of thoracic spine, initial encounter: Secondary | ICD-10-CM | POA: Diagnosis not present

## 2018-02-06 DIAGNOSIS — S338XXA Sprain of other parts of lumbar spine and pelvis, initial encounter: Secondary | ICD-10-CM | POA: Diagnosis not present

## 2018-02-06 DIAGNOSIS — S134XXA Sprain of ligaments of cervical spine, initial encounter: Secondary | ICD-10-CM | POA: Diagnosis not present

## 2018-02-11 DIAGNOSIS — S338XXA Sprain of other parts of lumbar spine and pelvis, initial encounter: Secondary | ICD-10-CM | POA: Diagnosis not present

## 2018-02-11 DIAGNOSIS — S134XXA Sprain of ligaments of cervical spine, initial encounter: Secondary | ICD-10-CM | POA: Diagnosis not present

## 2018-02-11 DIAGNOSIS — S233XXA Sprain of ligaments of thoracic spine, initial encounter: Secondary | ICD-10-CM | POA: Diagnosis not present

## 2018-08-08 ENCOUNTER — Other Ambulatory Visit: Payer: Self-pay | Admitting: General Practice

## 2018-08-08 DIAGNOSIS — Z20822 Contact with and (suspected) exposure to covid-19: Secondary | ICD-10-CM

## 2018-08-11 LAB — NOVEL CORONAVIRUS, NAA: SARS-CoV-2, NAA: NOT DETECTED

## 2018-10-22 ENCOUNTER — Encounter: Payer: 59 | Admitting: Obstetrics & Gynecology

## 2019-01-28 ENCOUNTER — Encounter: Payer: 59 | Admitting: Obstetrics & Gynecology

## 2019-04-02 ENCOUNTER — Other Ambulatory Visit: Payer: Self-pay

## 2019-04-03 ENCOUNTER — Encounter: Payer: 59 | Admitting: Obstetrics & Gynecology

## 2019-04-03 DIAGNOSIS — Z0289 Encounter for other administrative examinations: Secondary | ICD-10-CM

## 2020-01-06 IMAGING — MG DIGITAL SCREENING BILATERAL MAMMOGRAM WITH IMPLANTS, CAD AND ADJ
8 of 20 series · 8 of 40 positions shown · non-contrast
Comparison: None

CLINICAL DATA: Screening.

EXAM:
DIGITAL SCREENING BILATERAL MAMMOGRAM WITH IMPLANTS, CAD AND ADJUNCT
TOMO
The patient has retropectoral implants. Standard and implant
displaced views were performed.

[L CC]
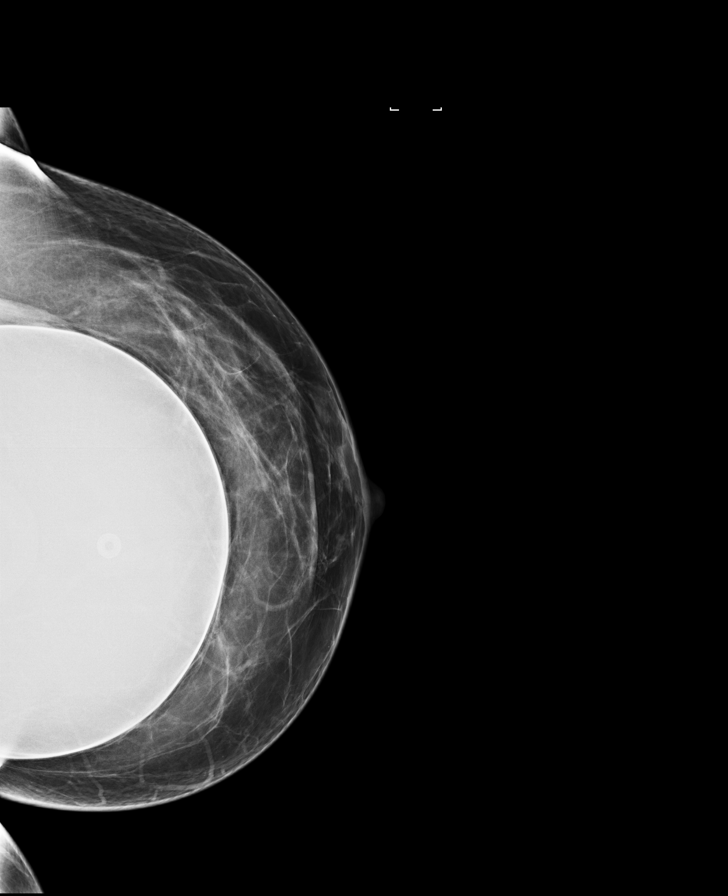

[R CC (1 of 2)]
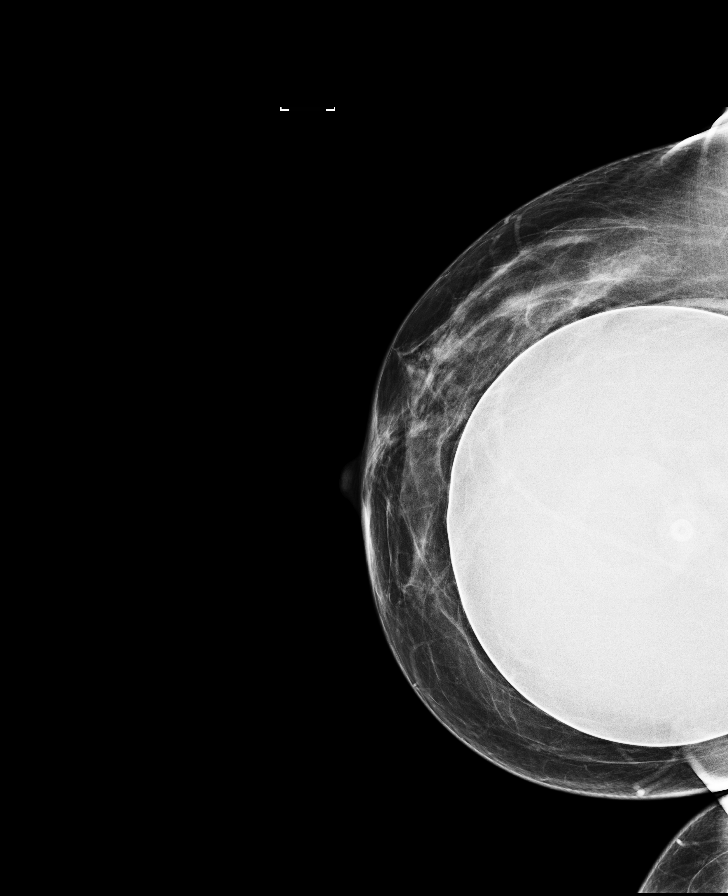

[R MLO]
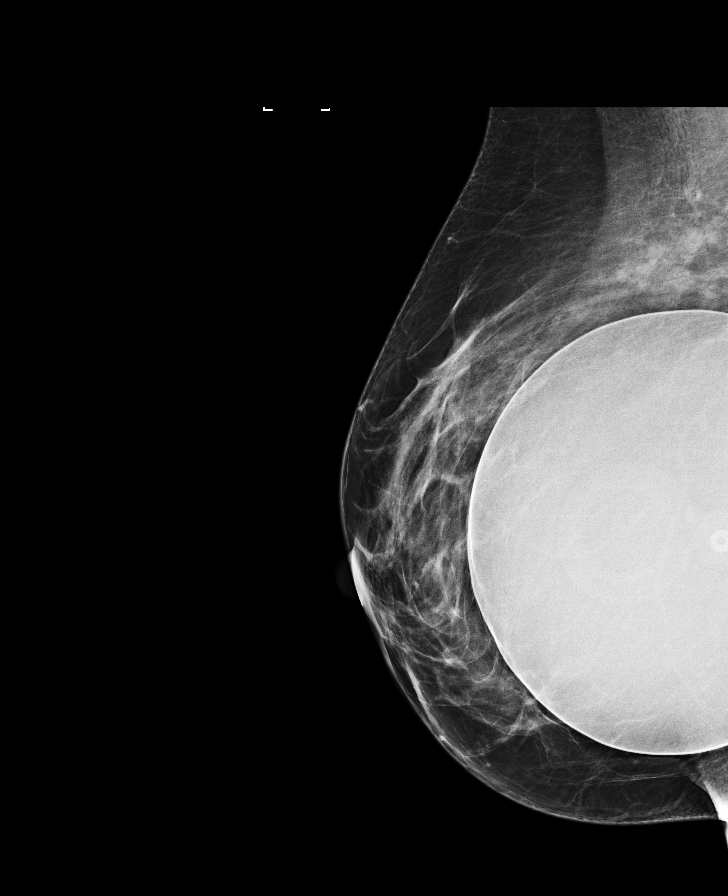

[L MLO]
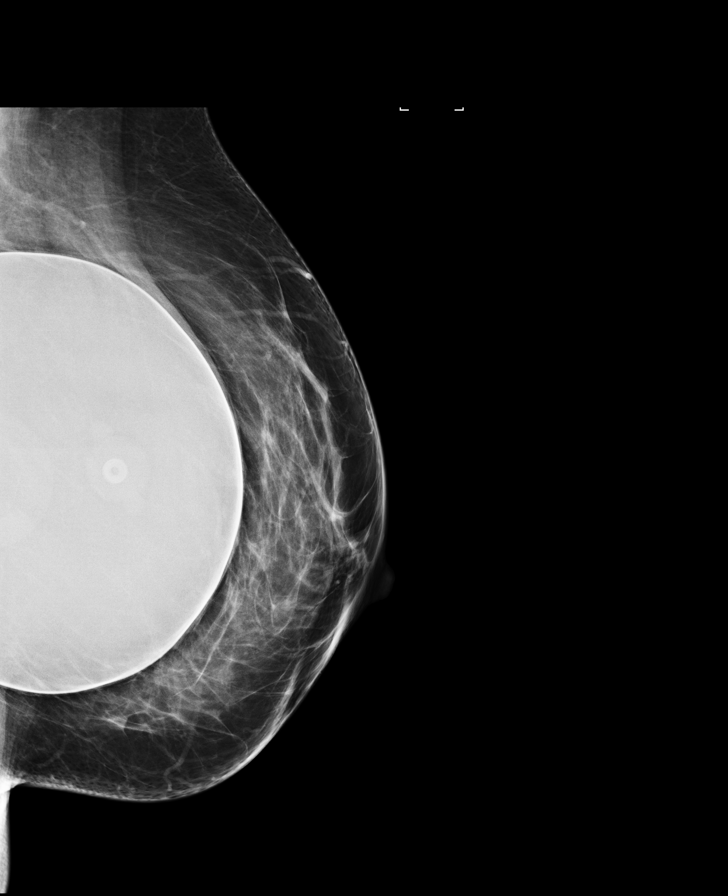

[L MLO synth-2D (1 of 2)]
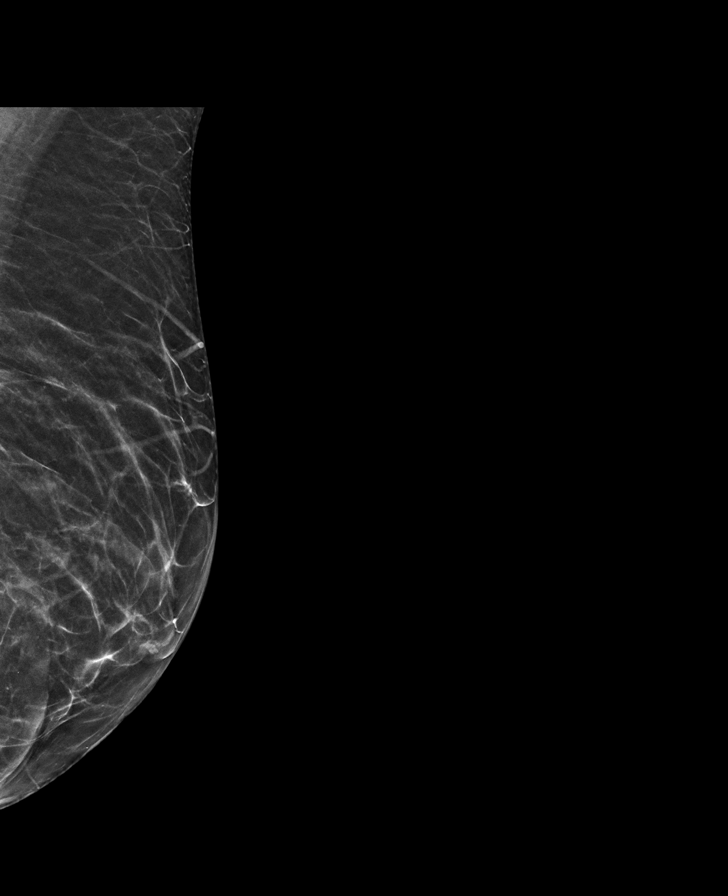

[R CC synth-2D]
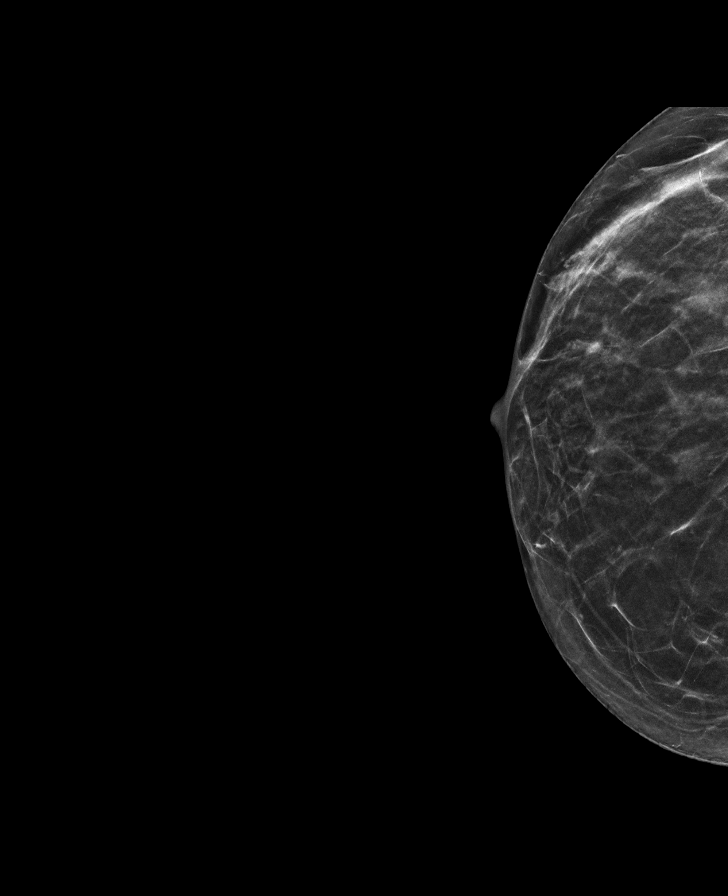

[L MLO synth-2D (2 of 2)]
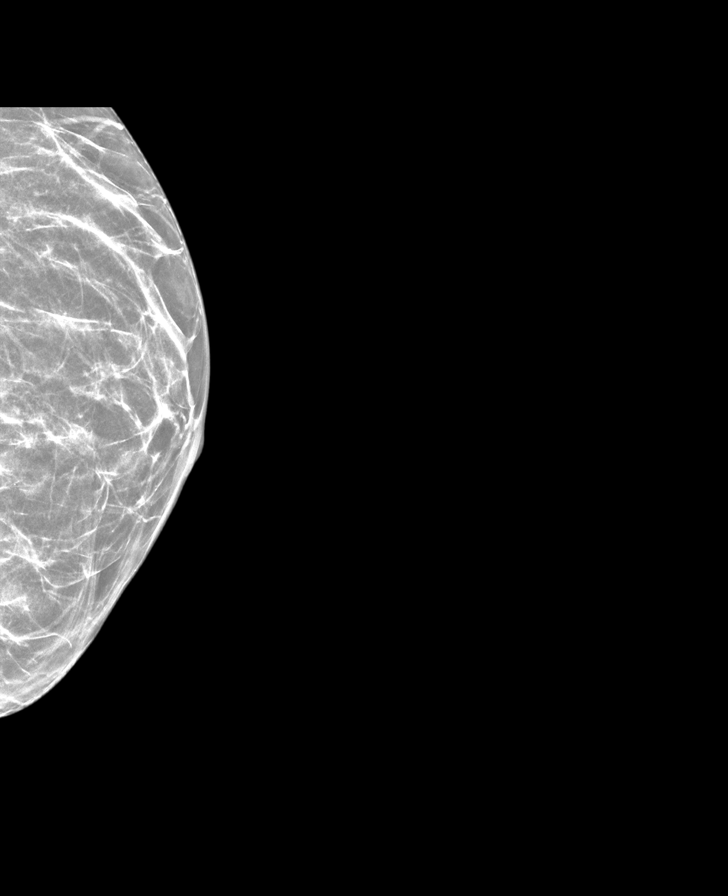

[R CC (2 of 2)]
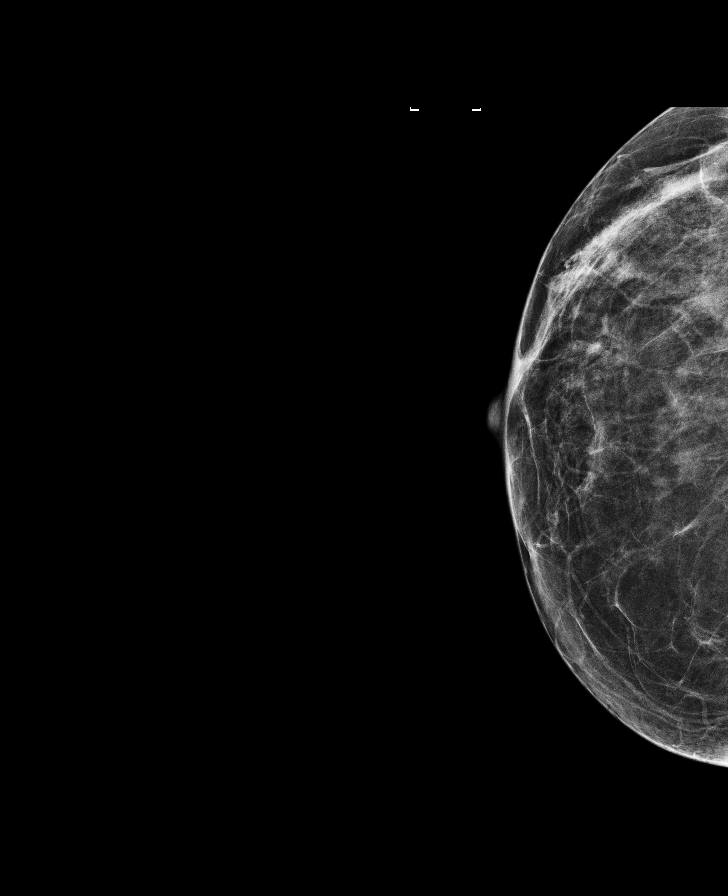

[8 of 40 positions shown; findings below may reference images not displayed]

ACR Breast Density Category b: There are scattered areas of
fibroglandular density.
FINDINGS: There are no findings suspicious for malignancy. Images were
processed with CAD.
IMPRESSION: No mammographic evidence of malignancy. A result letter of this
screening mammogram will be mailed directly to the patient.

RECOMMENDATION:
Screening mammogram in one year. (Code:X2-9-UM8)

BI-RADS CATEGORY  1:  Negative.

## 2020-03-21 NOTE — Progress Notes (Deleted)
Cardiology Office Note:   Date:  03/21/2020  NAME:  Emily Jacobson    MRN: [id number] DOB:  1970-05-17   PCP:  Patient, No Pcp Per  Cardiologist:  No primary care provider on file.  Electrophysiologist:  None   Referring MD: Suella Broad, MD   No chief complaint on file. ***  History of Present Illness:   Emily Jacobson is a 50 y.o. female with a hx of varicose veins who is being seen today for the evaluation of chest pain at the request of Suella Broad, MD.  Past Medical History: Past Medical History:  Diagnosis Date  . Varicose veins of both lower extremities     Past Surgical History: Past Surgical History:  Procedure Laterality Date  . AUGMENTATION MAMMAPLASTY Bilateral    Saline  . TUBAL LIGATION      Current Medications: No outpatient medications have been marked as taking for the 03/22/20 encounter (Appointment) with O'Neal, Cassie Freer, MD.     Allergies:    Penicillins   Social History: Social History   Socioeconomic History  . Marital status: Married    Spouse name: Not on file  . Number of children: Not on file  . Years of education: Not on file  . Highest education level: Not on file  Occupational History  . Not on file  Tobacco Use  . Smoking status: Never Smoker  . Smokeless tobacco: Never Used  Vaping Use  . Vaping Use: Never used  Substance and Sexual Activity  . Alcohol use: No  . Drug use: No  . Sexual activity: Yes    Partners: Male    Birth control/protection: None, Surgical    Comment: tubal ligation  Other Topics Concern  . Not on file  Social History Narrative  . Not on file   Social Determinants of Health   Financial Resource Strain: Not on file  Food Insecurity: Not on file  Transportation Needs: Not on file  Physical Activity: Not on file  Stress: Not on file  Social Connections: Not on file     Family History: The patient's ***family history includes Breast cancer in her maternal grandmother, paternal aunt, and  paternal aunt; Cancer in her maternal grandmother and paternal aunt; Migraines in her brother.  ROS:   All other ROS reviewed and negative. Pertinent positives noted in the HPI.     EKGs/Labs/Other Studies Reviewed:   The following studies were personally reviewed by me today:  EKG:  EKG is *** ordered today.  The ekg ordered today demonstrates ***, and was personally reviewed by me.   Recent Labs: No results found for requested labs within last 8760 hours.   Recent Lipid Panel No results found for: CHOL, TRIG, HDL, CHOLHDL, VLDL, LDLCALC, LDLDIRECT  Physical Exam:   VS:  There were no vitals taken for this visit.   Wt Readings from Last 3 Encounters:  10/22/17 204 lb 1.6 oz (92.6 kg)  10/16/17 208 lb (94.3 kg)  05/23/16 198 lb (89.8 kg)    General: Well nourished, well developed, in no acute distress Head: Atraumatic, normal size  Eyes: PEERLA, EOMI  Neck: Supple, no JVD Endocrine: No thryomegaly Cardiac: Normal S1, S2; RRR; no murmurs, rubs, or gallops Lungs: Clear to auscultation bilaterally, no wheezing, rhonchi or rales  Abd: Soft, nontender, no hepatomegaly  Ext: No edema, pulses 2+ Musculoskeletal: No deformities, BUE and BLE strength normal and equal Skin: Warm and dry, no rashes   Neuro: Alert and oriented to person, place,  time, and situation, CNII-XII grossly intact, no focal deficits  Psych: Normal mood and affect   ASSESSMENT:   Emily Jacobson is a 50 y.o. female who presents for the following: No diagnosis found.  PLAN:   There are no diagnoses linked to this encounter.  Disposition: No follow-ups on file.  Medication Adjustments/Labs and Tests Ordered: Current medicines are reviewed at length with the patient today.  Concerns regarding medicines are outlined above.  No orders of the defined types were placed in this encounter.  No orders of the defined types were placed in this encounter.   There are no Patient Instructions on file for this visit.    Time Spent with Patient: I have spent a total of *** minutes with patient reviewing hospital notes, telemetry, EKGs, labs and examining the patient as well as establishing an assessment and plan that was discussed with the patient.  > 50% of time was spent in direct patient care.  Signed, Addison Naegeli. Audie Box, MD, [place]  [address] [place], [place] [address] [phone]  03/21/2020 5:17 PM

## 2020-03-22 ENCOUNTER — Ambulatory Visit: Payer: 59 | Admitting: Cardiovascular Disease

## 2020-03-22 DIAGNOSIS — R079 Chest pain, unspecified: Secondary | ICD-10-CM

## 2020-07-02 ENCOUNTER — Encounter: Payer: Self-pay | Admitting: Obstetrics & Gynecology

## 2020-07-02 ENCOUNTER — Ambulatory Visit (INDEPENDENT_AMBULATORY_CARE_PROVIDER_SITE_OTHER): Payer: 59 | Admitting: Obstetrics & Gynecology

## 2020-07-02 ENCOUNTER — Other Ambulatory Visit: Payer: Self-pay

## 2020-07-02 ENCOUNTER — Other Ambulatory Visit (HOSPITAL_COMMUNITY)
Admission: RE | Admit: 2020-07-02 | Discharge: 2020-07-02 | Disposition: A | Discharge status: Home / Self Care | Payer: 59 | Source: Ambulatory Visit | Attending: Obstetrics & Gynecology | Admitting: Obstetrics & Gynecology

## 2020-07-02 VITALS — BP 118/76 | Ht 71.0 in | Wt 242.0 lb

## 2020-07-02 DIAGNOSIS — Z01419 Encounter for gynecological examination (general) (routine) without abnormal findings: Secondary | ICD-10-CM | POA: Diagnosis not present

## 2020-07-02 DIAGNOSIS — Z6833 Body mass index (BMI) 33.0-33.9, adult: Secondary | ICD-10-CM

## 2020-07-02 DIAGNOSIS — N951 Menopausal and female climacteric states: Secondary | ICD-10-CM | POA: Diagnosis not present

## 2020-07-02 DIAGNOSIS — E6609 Other obesity due to excess calories: Secondary | ICD-10-CM | POA: Diagnosis not present

## 2020-07-02 MED ORDER — MEDROXYPROGESTERONE ACETATE 5 MG PO TABS: 5.0000 mg | ORAL_TABLET | Freq: Every day | ORAL | 1 refills | Status: AC

## 2020-07-02 NOTE — Progress Notes (Signed)
Emily Jacobson 17-Dec-1970 [id number]   History:    50 y.o. G2P2L2 Remarried.  Husband had a stroke.  S/P TL.  Son 5 yo.  Daughter 59+ yo.   RP:  Established patient presenting for annual gyn exam   HPI: Status post tubal ligation.  Menstrual periods every month with normal flow, except now 2 weeks late.  No pelvic pain.  No pain with intercourse.  Normal vaginal secretions.  Breasts normal.  Urine and bowel movements normal.  Body mass index 33.75.  Not currently very physically active.  Fasting health labs here today.   Past medical history,surgical history, family history and social history were all reviewed and documented in the EPIC chart.  Gynecologic History Patient's last menstrual period was 05/13/2020.  Obstetric History OB History  Gravida Para Term Preterm AB Living  2 2       2   SAB IAB Ectopic Multiple Live Births               # Outcome Date GA Lbr Len/2nd Weight Sex Delivery Anes PTL Lv  2 Para           1 Para              ROS: A ROS was performed and pertinent positives and negatives are included in the history.  GENERAL: No fevers or chills. HEENT: No change in vision, no earache, sore throat or sinus congestion. NECK: No pain or stiffness. CARDIOVASCULAR: No chest pain or pressure. No palpitations. PULMONARY: No shortness of breath, cough or wheeze. GASTROINTESTINAL: No abdominal pain, nausea, vomiting or diarrhea, melena or bright red blood per rectum. GENITOURINARY: No urinary frequency, urgency, hesitancy or dysuria. MUSCULOSKELETAL: No joint or muscle pain, no back pain, no recent trauma. DERMATOLOGIC: No rash, no itching, no lesions. ENDOCRINE: No polyuria, polydipsia, no heat or cold intolerance. No recent change in weight. HEMATOLOGICAL: No anemia or easy bruising or bleeding. NEUROLOGIC: No headache, seizures, numbness, tingling or weakness. PSYCHIATRIC: No depression, no loss of interest in normal activity or change in sleep pattern.     Exam:   BP  118/76   Ht 5' 11"  (1.803 m)   Wt 242 lb (109.8 kg)   LMP 05/13/2020   BMI 33.75 kg/m   Body mass index is 33.75 kg/m.  General appearance : Well developed well nourished female. No acute distress HEENT: Eyes: no retinal hemorrhage or exudates,  Neck supple, trachea midline, no carotid bruits, no thyroidmegaly Lungs: Clear to auscultation, no rhonchi or wheezes, or rib retractions  Heart: Regular rate and rhythm, no murmurs or gallops Breast:Examined in sitting and supine position were symmetrical in appearance, no palpable masses or tenderness,  no skin retraction, no nipple inversion, no nipple discharge, no skin discoloration, no axillary or supraclavicular lymphadenopathy Abdomen: no palpable masses or tenderness, no rebound or guarding Extremities: no edema or skin discoloration or tenderness  Pelvic: Vulva: Normal             Vagina: No gross lesions or discharge  Cervix: No gross lesions or discharge.  Pap reflex done.  Uterus  AV, normal size, shape and consistency, non-tender and mobile  Adnexa  Without masses or tenderness  Anus: Normal   Assessment/Plan:  50 y.o. female for annual exam   1. Encounter for routine gynecological examination with Papanicolaou smear of cervix Normal gynecologic exam.  Pap reflex done.  Breast exam normal.  Last screening mammogram was negative January 2019, patient needs to schedule now.  Fasting health labs here today. - CBC - Comp Met (CMET) - Lipid Profile - TSH - Vitamin D 1,25 dihydroxy - Cytology - PAP( [place])  2. Perimenopause Occasional hot flushes/night sweats.  2 weeks late for her period currently.  Precautions associated with perimenopause in terms of abnormal bleeding reviewed.  Decision to prescribe Provera 5 mg/tab. 1 tablet per mouth daily for 10 days if no menses at 3 months.  No contraindication to Provera.  Prescription sent to pharmacy.  3. Class 1 obesity due to excess calories without serious comorbidity with  body mass index (BMI) of 33.0 to 33.9 in adult Recommend a lower calorie/carb diet.  Intermittent fasting suggested.  Aerobic activities 5 times a week and light weightlifting every 2 days.  Other orders - Multiple Vitamin (MULTIVITAMIN) tablet; Take 1 tablet by mouth daily. - medroxyPROGESTERone (PROVERA) 5 MG tablet; Take 1 tablet (5 mg total) by mouth daily for 10 days.   Princess Bruins MD, 11:57 AM 07/02/2020

## 2020-07-05 LAB — CYTOLOGY - PAP: Diagnosis: NEGATIVE

## 2020-07-06 LAB — CBC
HCT: 41.6 % (ref 35.0–45.0)
Hemoglobin: 14.2 g/dL (ref 11.7–15.5)
MCH: 29.5 pg (ref 27.0–33.0)
MCHC: 34.1 g/dL (ref 32.0–36.0)
MCV: 86.5 fL (ref 80.0–100.0)
MPV: 9.2 fL (ref 7.5–12.5)
Platelets: 358 10*3/uL (ref 140–400)
RBC: 4.81 10*6/uL (ref 3.80–5.10)
RDW: 13 % (ref 11.0–15.0)
WBC: 12 10*3/uL — ABNORMAL HIGH (ref 3.8–10.8)

## 2020-07-06 LAB — COMPREHENSIVE METABOLIC PANEL
AG Ratio: 1.7 (calc) (ref 1.0–2.5)
ALT: 10 U/L (ref 6–29)
AST: 13 U/L (ref 10–35)
Albumin: 4.4 g/dL (ref 3.6–5.1)
Alkaline phosphatase (APISO): 65 U/L (ref 31–125)
BUN: 9 mg/dL (ref 7–25)
CO2: 26 mmol/L (ref 20–32)
Calcium: 9.7 mg/dL (ref 8.6–10.2)
Chloride: 104 mmol/L (ref 98–110)
Creat: 0.98 mg/dL (ref 0.50–1.10)
Globulin: 2.6 g/dL (calc) (ref 1.9–3.7)
Glucose, Bld: 81 mg/dL (ref 65–99)
Potassium: 4 mmol/L (ref 3.5–5.3)
Sodium: 139 mmol/L (ref 135–146)
Total Bilirubin: 0.4 mg/dL (ref 0.2–1.2)
Total Protein: 7 g/dL (ref 6.1–8.1)

## 2020-07-06 LAB — TSH: TSH: 2.01 mIU/L

## 2020-07-06 LAB — VITAMIN D 1,25 DIHYDROXY
Vitamin D 1, 25 (OH)2 Total: 40 pg/mL (ref 18–72)
Vitamin D2 1, 25 (OH)2: <8 pg/mL
Vitamin D3 1, 25 (OH)2: 40 pg/mL

## 2020-07-06 LAB — LIPID PANEL
Cholesterol: 190 mg/dL (ref <200)
HDL: 50 mg/dL (ref >=50)
LDL Cholesterol (Calc): 110 mg/dL (calc) — ABNORMAL HIGH
Non-HDL Cholesterol (Calc): 140 mg/dL (calc) — ABNORMAL HIGH (ref <130)
Total CHOL/HDL Ratio: 3.8 (calc) (ref <5.0)
Triglycerides: 189 mg/dL — ABNORMAL HIGH (ref <150)

## 2020-08-30 NOTE — Progress Notes (Signed)
Cardiology Office Note:    Date:  08/31/2020   ID:  Emily Jacobson, DOB 1970/12/27, MRN [id number]  PCP:  Patient, No Pcp Per (Inactive)  Cardiologist:  None  Electrophysiologist:  None   Referring MD: Suella Broad, MD   Chief Complaint  Patient presents with   New Patient (Initial Visit)   Edema    Ankles.   Chest Pain    History of Present Illness:    Emily Jacobson is a 50 y.o. female with a degenerative disc disease who is referred by Dr. Nelva Bush for evaluation of chest pain.  She reports she has been having intermittent left-sided chest pain.  Last for few seconds and resolves.  Occurs at rest, no relationship with exertion.  She is swimming 3 times per week for 30 minutes.  Denies any exertional chest pain or dyspnea.  Does report occasional lower extremity edema at the end of the day.  Denies any palpitations.  No smoking history.  No known family history of heart disease.   Past Medical History:  Diagnosis Date   Varicose veins of both lower extremities     Past Surgical History:  Procedure Laterality Date   AUGMENTATION MAMMAPLASTY Bilateral    Saline   TUBAL LIGATION      Current Medications: Current Meds  Medication Sig   HYDROcodone-acetaminophen (NORCO/VICODIN) 5-325 MG tablet Take 1 tablet by mouth every 6 (six) hours as needed for moderate pain.   ibuprofen (ADVIL,MOTRIN) 400 MG tablet Take 400 mg by mouth every 6 (six) hours as needed.   medroxyPROGESTERone (PROVERA) 5 MG tablet Take 1 tablet (5 mg total) by mouth daily for 10 days.   Multiple Vitamin (MULTIVITAMIN) tablet Take 1 tablet by mouth daily.     Allergies:   Penicillins   Social History   Socioeconomic History   Marital status: Married    Spouse name: Not on file   Number of children: Not on file   Years of education: Not on file   Highest education level: Not on file  Occupational History   Not on file  Tobacco Use   Smoking status: Never   Smokeless tobacco: Never  Vaping Use    Vaping Use: Never used  Substance and Sexual Activity   Alcohol use: No   Drug use: No   Sexual activity: Yes    Partners: Male    Birth control/protection: Surgical    Comment: tubal ligation  Other Topics Concern   Not on file  Social History Narrative   Not on file   Social Determinants of Health   Financial Resource Strain: Not on file  Food Insecurity: Not on file  Transportation Needs: Not on file  Physical Activity: Not on file  Stress: Not on file  Social Connections: Not on file     Family History: The patient's family history includes Breast cancer in her maternal grandmother, paternal aunt, and paternal aunt; Cancer in her maternal grandmother and paternal aunt; Liver disease in her mother; Migraines in her brother.  ROS:   Please see the history of present illness.     All other systems reviewed and are negative.  EKGs/Labs/Other Studies Reviewed:    The following studies were reviewed today:   EKG:  EKG is  ordered today.  The ekg ordered today demonstrates normal sinus rhythm, rate 73, no ST/T abnormality  Recent Labs: 07/02/2020: ALT 10; BUN 9; Creat 0.98; Hemoglobin 14.2; Platelets 358; Potassium 4.0; Sodium 139; TSH 2.01  Recent Lipid Panel  Component Value Date/Time   CHOL 190 07/02/2020 1216   TRIG 189 (H) 07/02/2020 1216   HDL 50 07/02/2020 1216   CHOLHDL 3.8 07/02/2020 1216   LDLCALC 110 (H) 07/02/2020 1216    Physical Exam:    VS:  BP 100/62 (BP Location: Left Arm, Patient Position: Sitting, Cuff Size: Normal)   Pulse 73   Ht 5' 11.5" (1.816 m)   Wt 241 lb (109.3 kg)   BMI 33.14 kg/m     Wt Readings from Last 3 Encounters:  08/31/20 241 lb (109.3 kg)  07/02/20 242 lb (109.8 kg)  10/22/17 204 lb 1.6 oz (92.6 kg)     GEN:  Well nourished, well developed in no acute distress HEENT: Normal NECK: No JVD; No carotid bruits LYMPHATICS: No lymphadenopathy CARDIAC: RRR, no murmurs, rubs, gallops RESPIRATORY:  Clear to auscultation  without rales, wheezing or rhonchi  ABDOMEN: Soft, non-tender, non-distended MUSCULOSKELETAL:  No edema; No deformity  SKIN: Warm and dry NEUROLOGIC:  Alert and oriented x 3 PSYCHIATRIC:  Normal affect   ASSESSMENT:    1. Lipid screening   2. Screening for diabetes mellitus (DM)   3. Chest pain of uncertain etiology    PLAN:    Chest pain: Description suggests noncardiac chest pain, as reports sharp pain lasting for few seconds and not related to exertion.  Suspect musculoskeletal pain.  Given age and lack of cardiac risk factors, no further work-up recommended at this time.  Hyperlipidemia: LDL 110 on 07/02/2020.  10-year ASCVD risk score 1%.  Discussed calcium score for further risk stratification.  She would like to hold off at this time and recheck lipid panel, as she has made lifestyle changes since this was checked  Diabetes screening: Check A1c  RTC in 1 year  Medication Adjustments/Labs and Tests Ordered: Current medicines are reviewed at length with the patient today.  Concerns regarding medicines are outlined above.  Orders Placed This Encounter  Procedures   Lipid panel   Hemoglobin A1c   EKG 12-Lead   No orders of the defined types were placed in this encounter.   Patient Instructions  Medication Instructions:  Your physician recommends that you continue on your current medications as directed. Please refer to the Current Medication list given to you today.  *If you need a refill on your cardiac medications before your next appointment, please call your pharmacy*   Lab Work: Lipid, A1C today  If you have labs (blood work) drawn today and your tests are completely normal, you will receive your results only by: [place] (if you have MyChart) OR A paper copy in the mail If you have any lab test that is abnormal or we need to change your treatment, we will call you to review the results.  Follow-Up: At [facility], you and your health needs are our  priority.  As part of our continuing mission to provide you with exceptional heart care, we have created designated Provider Care Teams.  These Care Teams include your primary Cardiologist (physician) and Advanced Practice Providers (APPs -  Physician Assistants and Nurse Practitioners) who all work together to provide you with the care you need, when you need it.  We recommend signing up for the patient portal called "MyChart".  Sign up information is provided on this After Visit Summary.  MyChart is used to connect with patients for Virtual Visits (Telemedicine).  Patients are able to view lab/test results, encounter notes, upcoming appointments, etc.  Non-urgent messages can be sent  to your provider as well.   To learn more about what you can do with MyChart, go to [email or web address].    Your next appointment:   12 month(s)  The format for your next appointment:   In Person  Provider:   Oswaldo Milian, MD      Signed, Donato Heinz, MD  08/31/2020 2:39 PM    [place]

## 2020-08-31 ENCOUNTER — Other Ambulatory Visit: Payer: Self-pay

## 2020-08-31 ENCOUNTER — Encounter: Payer: Self-pay | Admitting: Cardiology

## 2020-08-31 ENCOUNTER — Ambulatory Visit: Payer: 59 | Admitting: Cardiology

## 2020-08-31 VITALS — BP 100/62 | HR 73 | Ht 71.5 in | Wt 241.0 lb

## 2020-08-31 DIAGNOSIS — Z131 Encounter for screening for diabetes mellitus: Secondary | ICD-10-CM

## 2020-08-31 DIAGNOSIS — R079 Chest pain, unspecified: Secondary | ICD-10-CM

## 2020-08-31 DIAGNOSIS — Z13220 Encounter for screening for lipoid disorders: Secondary | ICD-10-CM

## 2020-08-31 NOTE — Patient Instructions (Signed)
Medication Instructions:  Your physician recommends that you continue on your current medications as directed. Please refer to the Current Medication list given to you today.  *If you need a refill on your cardiac medications before your next appointment, please call your pharmacy*   Lab Work: Lipid, A1C today  If you have labs (blood work) drawn today and your tests are completely normal, you will receive your results only by: [place] (if you have MyChart) OR A paper copy in the mail If you have any lab test that is abnormal or we need to change your treatment, we will call you to review the results.  Follow-Up: At [facility], you and your health needs are our priority.  As part of our continuing mission to provide you with exceptional heart care, we have created designated Provider Care Teams.  These Care Teams include your primary Cardiologist (physician) and Advanced Practice Providers (APPs -  Physician Assistants and Nurse Practitioners) who all work together to provide you with the care you need, when you need it.  We recommend signing up for the patient portal called "MyChart".  Sign up information is provided on this After Visit Summary.  MyChart is used to connect with patients for Virtual Visits (Telemedicine).  Patients are able to view lab/test results, encounter notes, upcoming appointments, etc.  Non-urgent messages can be sent to your provider as well.   To learn more about what you can do with MyChart, go to [email or web address].    Your next appointment:   12 month(s)  The format for your next appointment:   In Person  Provider:   Oswaldo Milian, MD

## 2020-09-01 LAB — LIPID PANEL
Chol/HDL Ratio: 3.9 ratio (ref 0.0–4.4)
Cholesterol, Total: 166 mg/dL (ref 100–199)
HDL: 43 mg/dL (ref >39)
LDL Chol Calc (NIH): 90 mg/dL (ref 0–99)
Triglycerides: 193 mg/dL — ABNORMAL HIGH (ref 0–149)
VLDL Cholesterol Cal: 33 mg/dL (ref 5–40)

## 2020-09-01 LAB — HEMOGLOBIN A1C
Est. average glucose Bld gHb Est-mCnc: 103 mg/dL
Hgb A1c MFr Bld: 5.2 % (ref 4.8–5.6)

## 2020-09-03 ENCOUNTER — Encounter: Payer: Self-pay | Admitting: *Deleted

## 2021-07-06 ENCOUNTER — Ambulatory Visit: Payer: 59 | Admitting: Obstetrics & Gynecology

## 2021-09-21 ENCOUNTER — Emergency Department (HOSPITAL_COMMUNITY): Payer: 59

## 2021-09-21 ENCOUNTER — Encounter (HOSPITAL_COMMUNITY): Payer: Self-pay | Admitting: Emergency Medicine

## 2021-09-21 ENCOUNTER — Emergency Department (HOSPITAL_COMMUNITY)
Admission: EM | Admit: 2021-09-21 | Discharge: 2021-09-22 | Disposition: A | Discharge status: Home / Self Care | Payer: 59 | Attending: Emergency Medicine | Admitting: Emergency Medicine

## 2021-09-21 DIAGNOSIS — R112 Nausea with vomiting, unspecified: Secondary | ICD-10-CM | POA: Insufficient documentation

## 2021-09-21 DIAGNOSIS — R519 Headache, unspecified: Secondary | ICD-10-CM | POA: Insufficient documentation

## 2021-09-21 DIAGNOSIS — Z5321 Procedure and treatment not carried out due to patient leaving prior to being seen by health care provider: Secondary | ICD-10-CM | POA: Diagnosis not present

## 2021-09-21 LAB — CBC
HCT: 39.7 % (ref 36.0–46.0)
Hemoglobin: 13.7 g/dL (ref 12.0–15.0)
MCH: 29.6 pg (ref 26.0–34.0)
MCHC: 34.5 g/dL (ref 30.0–36.0)
MCV: 85.7 fL (ref 80.0–100.0)
Platelets: 365 10*3/uL (ref 150–400)
RBC: 4.63 MIL/uL (ref 3.87–5.11)
RDW: 13.8 % (ref 11.5–15.5)
WBC: 14 10*3/uL — ABNORMAL HIGH (ref 4.0–10.5)
nRBC: 0 % (ref 0.0–0.2)

## 2021-09-21 LAB — I-STAT BETA HCG BLOOD, ED (MC, WL, AP ONLY): I-stat hCG, quantitative: <5 m[IU]/mL (ref <5)

## 2021-09-21 LAB — COMPREHENSIVE METABOLIC PANEL
ALT: 11 U/L (ref 0–44)
AST: 16 U/L (ref 15–41)
Albumin: 4.3 g/dL (ref 3.5–5.0)
Alkaline Phosphatase: 50 U/L (ref 38–126)
Anion gap: 14 (ref 5–15)
BUN: 9 mg/dL (ref 6–20)
CO2: 22 mmol/L (ref 22–32)
Calcium: 9.8 mg/dL (ref 8.9–10.3)
Chloride: 102 mmol/L (ref 98–111)
Creatinine, Ser: 1.06 mg/dL — ABNORMAL HIGH (ref 0.44–1.00)
GFR, Estimated: >60 mL/min (ref >60)
Glucose, Bld: 107 mg/dL — ABNORMAL HIGH (ref 70–99)
Potassium: 3.9 mmol/L (ref 3.5–5.1)
Sodium: 138 mmol/L (ref 135–145)
Total Bilirubin: 0.2 mg/dL — ABNORMAL LOW (ref 0.3–1.2)
Total Protein: 6.8 g/dL (ref 6.5–8.1)

## 2021-09-21 NOTE — ED Notes (Signed)
Pt no longer wanted to wait

## 2021-09-21 NOTE — ED Provider Triage Note (Signed)
Emergency Medicine Provider Triage Evaluation Note  Emily Jacobson , a 51 y.o. female  was evaluated in triage.  Pt complains of gradual onset frontal headache (hydrocodone without relief) states noon was when her sx started. Started feeling nauseated and then vomited around 4pm.    Review of Systems  Positive: Headache, NV Negative: Fever   Physical Exam  BP 132/72 (BP Location: Right Arm)   Pulse 87   Temp 98.1 F (36.7 C) (Oral)   Resp 20   Ht 5' 11.5" (1.816 m)   Wt 95.3 kg   LMP 08/21/2021   SpO2 100%   BMI 28.88 kg/m  Gen:   Awake, acutely uncomfortable Resp:  Normal effort  MSK:   Moves extremities without difficulty  Other:    Alert and oriented to self, place, time and event.   Speech is fluent, clear without dysarthria or dysphasia.   Strength 5/5 in upper/lower extremities   Sensation intact in upper/lower extremities   CN I not tested  CN II grossly intact visual fields bilaterally. Did not visualize posterior eye.  CN III, IV, VI PERRLA and EOMs intact bilaterally  CN V Intact sensation to sharp and light touch to the face  CN VII facial movements symmetric  CN VIII not tested  CN IX, X no uvula deviation, symmetric rise of soft palate  CN XI 5/5 SCM and trapezius strength bilaterally  CN XII Midline tongue protrusion, symmetric L/R movements    Medical Decision Making  Medically screening exam initiated at 5:12 PM.  Appropriate orders placed.  Eldene Beg was informed that the remainder of the evaluation will be completed by another provider, this initial triage assessment does not replace that evaluation, and the importance of remaining in the ED until their evaluation is complete.  CT head, labs      Tedd Sias, [place] 09/21/21 1715

## 2021-09-21 NOTE — ED Notes (Signed)
Patient states she has "Recovered" and no longer wants to stay

## 2021-09-21 NOTE — ED Triage Notes (Signed)
Pt endorses gradual headache today. Took hydrocodone about 2 hours ago that caused n/v.

## 2022-01-02 ENCOUNTER — Other Ambulatory Visit: Payer: Self-pay | Admitting: Family Medicine

## 2022-01-02 DIAGNOSIS — K8020 Calculus of gallbladder without cholecystitis without obstruction: Secondary | ICD-10-CM

## 2022-01-05 ENCOUNTER — Ambulatory Visit
Admission: RE | Admit: 2022-01-05 | Discharge: 2022-01-05 | Disposition: A | Discharge status: Home / Self Care | Payer: 59 | Source: Ambulatory Visit | Attending: Family Medicine | Admitting: Family Medicine

## 2022-01-05 DIAGNOSIS — K8020 Calculus of gallbladder without cholecystitis without obstruction: Secondary | ICD-10-CM

## 2022-07-25 ENCOUNTER — Ambulatory Visit (INDEPENDENT_AMBULATORY_CARE_PROVIDER_SITE_OTHER): Payer: 59 | Admitting: Podiatry

## 2022-07-25 ENCOUNTER — Ambulatory Visit: Payer: 59

## 2022-07-25 DIAGNOSIS — M205X1 Other deformities of toe(s) (acquired), right foot: Secondary | ICD-10-CM

## 2022-07-25 MED ORDER — MELOXICAM 15 MG PO TABS: 15.0000 mg | ORAL_TABLET | Freq: Every day | ORAL | 0 refills | Status: AC

## 2022-07-25 NOTE — Progress Notes (Signed)
  Subjective:  Patient ID: Emily Jacobson, female    DOB: 09-Oct-1970,   MRN: [id number]  Chief Complaint  Patient presents with   Foot Pain    right foot pain / discoloration on the top    52 y.o. female presents for concern of right foot pain that has been going on for  years.   Relates she has had injection in her great toe before and has some discoloration around the joint after this. The discoloration has been present for a year and was concerned about this. Also relates the pain in the great toe has started to come back and getting more noticeable especially after being on the feet for long periods. She wears supportive shoes and inserts.  . Denies any other pedal complaints. Denies n/v/f/c.   Past Medical History:  Diagnosis Date   Back pain    Varicose veins of both lower extremities     Objective:  Physical Exam: Vascular: DP/PT pulses 2/4 bilateral. CFT <3 seconds. Normal hair growth on digits. No edema.  Skin. No lacerations or abrasions bilateral feet. Red pigmentation noted to dorsum of first MPJ along the extensor tendon. No pain along this area. Some areas of hypopigmentation.  Musculoskeletal: MMT 5/5 bilateral lower extremities in DF, PF, Inversion and Eversion. Deceased ROM in DF of ankle joint. Mildly tender to dorsum of first MPJ and some pain to medial eminence Some pain with ROM of the first MPJ more so with DF.  Neurological: Sensation intact to light touch.   Assessment:   1. Hallux limitus, right      Plan:  Patient was evaluated and treated and all questions answered. X-rays reviewed and discussed with patient. No acute fractures or dislocations noted.  Bipartite medial sesamoid noted. Mild elevation noted of the first metatarsal. Mild narrowing of first MTPJ minimal degenerative changes noted.  -Discussed hallux limitus and  treatement options; conservative and  Surgical management; risks, benefits, alternatives discussed. All patient's questions  answered. -Discussed discoloration likely pigmentation from previous steroid injection.  -Rx Meloxicam provided as needed. Reviewed BMP and kidneys labs normal range.  -Patient defers injection today but discussed possibility in the future.  -Recommend continue with good supportive shoes and inserts.  Discussed possible need for surgery in future. Discussed cheilectomy prior to any fusion of the great toe joint. Discussed perioperative course and patient will consider possible in colder months.  -Patient to return to office as needed or sooner if condition worsens.    Louann Sjogren, DPM

## 2022-08-01 ENCOUNTER — Telehealth: Payer: Self-pay

## 2022-08-01 NOTE — Telephone Encounter (Signed)
ATC no available voicemail. We need to know where pt's most receant scans/x-rays where done so we can request images for consult on 08/03/22 with Dr. Delton Coombes

## 2022-08-03 ENCOUNTER — Institutional Professional Consult (permissible substitution): Payer: 59 | Admitting: Emergency Medicine

## 2022-08-03 ENCOUNTER — Encounter: Payer: Self-pay | Admitting: Emergency Medicine

## 2022-10-06 ENCOUNTER — Other Ambulatory Visit: Payer: Self-pay | Admitting: Orthopedic Surgery

## 2022-10-06 DIAGNOSIS — M546 Pain in thoracic spine: Secondary | ICD-10-CM

## 2022-10-13 ENCOUNTER — Ambulatory Visit
Admission: RE | Admit: 2022-10-13 | Discharge: 2022-10-13 | Disposition: A | Discharge status: Home / Self Care | Payer: 59 | Source: Ambulatory Visit | Attending: Orthopedic Surgery | Admitting: Orthopedic Surgery

## 2022-10-13 DIAGNOSIS — M546 Pain in thoracic spine: Secondary | ICD-10-CM

## 2022-10-20 ENCOUNTER — Other Ambulatory Visit: Payer: 59

## 2023-02-07 ENCOUNTER — Ambulatory Visit (INDEPENDENT_AMBULATORY_CARE_PROVIDER_SITE_OTHER): Payer: 59 | Admitting: Podiatry

## 2023-02-07 DIAGNOSIS — Z91199 Patient's noncompliance with other medical treatment and regimen due to unspecified reason: Secondary | ICD-10-CM

## 2023-02-07 NOTE — Progress Notes (Signed)
No show

## 2024-01-28 ENCOUNTER — Other Ambulatory Visit: Payer: Self-pay | Admitting: Rehabilitation

## 2024-01-28 DIAGNOSIS — M545 Low back pain, unspecified: Secondary | ICD-10-CM

## 2024-01-28 DIAGNOSIS — M5416 Radiculopathy, lumbar region: Secondary | ICD-10-CM

## 2024-01-31 ENCOUNTER — Ambulatory Visit
Admission: RE | Admit: 2024-01-31 | Discharge: 2024-01-31 | Disposition: A | Source: Ambulatory Visit | Attending: Rehabilitation | Admitting: Rehabilitation

## 2024-01-31 DIAGNOSIS — M545 Low back pain, unspecified: Secondary | ICD-10-CM

## 2024-01-31 DIAGNOSIS — M5416 Radiculopathy, lumbar region: Secondary | ICD-10-CM

## 2024-02-05 ENCOUNTER — Other Ambulatory Visit
